# Patient Record
Sex: Female | Born: 1955
Health system: Southern US, Community
[De-identification: ages and names within clinical notes are randomized; demographics above are authoritative.]

## PROBLEM LIST (undated history)

## (undated) DIAGNOSIS — F419 Anxiety disorder, unspecified: Secondary | ICD-10-CM

## (undated) DIAGNOSIS — J45909 Unspecified asthma, uncomplicated: Secondary | ICD-10-CM

## (undated) DIAGNOSIS — J019 Acute sinusitis, unspecified: Secondary | ICD-10-CM

## (undated) HISTORY — PX: LARYNGOSCOPY: SUR817

## (undated) HISTORY — PX: TONSILLECTOMY: SUR1361

## (undated) HISTORY — PX: ADENOIDECTOMY: SUR15

## (undated) HISTORY — DX: Unspecified asthma, uncomplicated: J45.909

## (undated) HISTORY — PX: ABDOMINAL HYSTERECTOMY: SHX81

## (undated) HISTORY — DX: Acute sinusitis, unspecified: J01.90

## (undated) HISTORY — DX: Anxiety disorder, unspecified: F41.9

---

## 1998-06-09 ENCOUNTER — Emergency Department (HOSPITAL_COMMUNITY): Admission: EM | Admit: 1998-06-09 | Discharge: 1998-06-09 | Payer: Self-pay | Admitting: Emergency Medicine

## 1999-03-29 ENCOUNTER — Emergency Department (HOSPITAL_COMMUNITY): Admission: EM | Admit: 1999-03-29 | Discharge: 1999-03-29 | Payer: Self-pay | Admitting: Emergency Medicine

## 2000-09-27 ENCOUNTER — Other Ambulatory Visit: Admission: RE | Admit: 2000-09-27 | Discharge: 2000-09-27 | Payer: Self-pay | Admitting: *Deleted

## 2001-09-27 ENCOUNTER — Ambulatory Visit: Admission: RE | Admit: 2001-09-27 | Discharge: 2001-09-27 | Payer: Self-pay | Admitting: Internal Medicine

## 2001-09-28 ENCOUNTER — Other Ambulatory Visit: Admission: RE | Admit: 2001-09-28 | Discharge: 2001-09-28 | Payer: Self-pay | Admitting: *Deleted

## 2002-09-28 ENCOUNTER — Other Ambulatory Visit: Admission: RE | Admit: 2002-09-28 | Discharge: 2002-09-28 | Payer: Self-pay | Admitting: *Deleted

## 2003-11-17 ENCOUNTER — Emergency Department (HOSPITAL_COMMUNITY): Admission: EM | Admit: 2003-11-17 | Discharge: 2003-11-18 | Payer: Self-pay | Admitting: Emergency Medicine

## 2003-11-26 ENCOUNTER — Other Ambulatory Visit: Admission: RE | Admit: 2003-11-26 | Discharge: 2003-11-26 | Payer: Self-pay | Admitting: *Deleted

## 2004-12-01 ENCOUNTER — Other Ambulatory Visit: Admission: RE | Admit: 2004-12-01 | Discharge: 2004-12-01 | Payer: Self-pay | Admitting: *Deleted

## 2005-12-09 ENCOUNTER — Emergency Department (HOSPITAL_COMMUNITY): Admission: EM | Admit: 2005-12-09 | Discharge: 2005-12-09 | Payer: Self-pay | Admitting: Emergency Medicine

## 2005-12-30 ENCOUNTER — Other Ambulatory Visit: Admission: RE | Admit: 2005-12-30 | Discharge: 2005-12-30 | Payer: Self-pay | Admitting: *Deleted

## 2006-12-21 ENCOUNTER — Other Ambulatory Visit: Admission: RE | Admit: 2006-12-21 | Discharge: 2006-12-21 | Payer: Self-pay | Admitting: *Deleted

## 2007-08-05 ENCOUNTER — Encounter: Admission: RE | Admit: 2007-08-05 | Discharge: 2007-08-05 | Payer: Self-pay | Admitting: Family Medicine

## 2009-06-05 ENCOUNTER — Emergency Department (HOSPITAL_COMMUNITY): Admission: EM | Admit: 2009-06-05 | Discharge: 2009-06-05 | Payer: Self-pay | Admitting: Emergency Medicine

## 2010-02-24 ENCOUNTER — Other Ambulatory Visit: Payer: Self-pay | Admitting: Family Medicine

## 2010-02-24 ENCOUNTER — Other Ambulatory Visit (HOSPITAL_COMMUNITY)
Admission: RE | Admit: 2010-02-24 | Discharge: 2010-02-24 | Disposition: A | Discharge status: Home / Self Care | Payer: 59 | Source: Ambulatory Visit | Attending: Family Medicine | Admitting: Family Medicine

## 2010-02-24 DIAGNOSIS — Z124 Encounter for screening for malignant neoplasm of cervix: Secondary | ICD-10-CM | POA: Insufficient documentation

## 2011-06-12 ENCOUNTER — Other Ambulatory Visit: Payer: Self-pay | Admitting: Gastroenterology

## 2011-12-23 ENCOUNTER — Other Ambulatory Visit: Payer: Self-pay | Admitting: Family Medicine

## 2011-12-23 DIAGNOSIS — R102 Pelvic and perineal pain: Secondary | ICD-10-CM

## 2011-12-24 ENCOUNTER — Ambulatory Visit
Admission: RE | Admit: 2011-12-24 | Discharge: 2011-12-24 | Disposition: A | Discharge status: Home / Self Care | Payer: 59 | Source: Ambulatory Visit | Attending: Family Medicine | Admitting: Family Medicine

## 2011-12-24 DIAGNOSIS — R102 Pelvic and perineal pain: Secondary | ICD-10-CM

## 2012-05-19 ENCOUNTER — Ambulatory Visit (INDEPENDENT_AMBULATORY_CARE_PROVIDER_SITE_OTHER): Payer: 59 | Admitting: Emergency Medicine

## 2012-05-19 VITALS — BP 96/56 | HR 49 | Temp 97.6°F | Resp 16 | Ht 65.75 in | Wt 111.2 lb

## 2012-05-19 DIAGNOSIS — Z111 Encounter for screening for respiratory tuberculosis: Secondary | ICD-10-CM

## 2012-05-19 NOTE — Progress Notes (Signed)
[facility] [address] [place] [address] [phone]  Date:  05/19/2012   Name:  Alejandra Young   DOB:  09/30/1955   MRN:  [id number]  PCP:  Gweneth Dimitri, MD    Chief Complaint: Immunizations   History of Present Illness:  Alejandra Young is a 57 y.o. very pleasant female patient who presents with the following:  Needs a TB test for rotation at [facility].  No history of TB  There are no active problems to display for this patient.   No past medical history on file.  No past surgical history on file.  History  Substance Use Topics  . Smoking status: Former Games developer  . Smokeless tobacco: Not on file  . Alcohol Use: Not on file    No family history on file.  Allergies  Allergen Reactions  . Codeine Anxiety    Medication list has been reviewed and updated.  No current outpatient prescriptions on file prior to visit.   No current facility-administered medications on file prior to visit.    Review of Systems:  As per HPI, otherwise negative.    Physical Examination: Filed Vitals:   05/19/12 1806  BP: 96/56  Pulse: 49  Temp: 97.6 F (36.4 C)  Resp: 16   Filed Vitals:   05/19/12 1806  Height: 5' 5.75" (1.67 m)  Weight: 111 lb 3.2 oz (50.44 kg)   Body mass index is 18.09 kg/(m^2). Ideal Body Weight: Weight in (lb) to have BMI = 25: 153.4   GEN: WDWN, NAD, Non-toxic, Alert & Oriented x 3 HEENT: Atraumatic, Normocephalic.  Ears and Nose: No external deformity. EXTR: No clubbing/cyanosis/edema NEURO: Normal gait.  PSYCH: Normally interactive. Conversant. Not depressed or anxious appearing.  Calm demeanor.    Assessment and Plan: TB skin test   Signed,  Phillips Odor, MD

## 2012-05-19 NOTE — Patient Instructions (Addendum)

## 2012-05-19 NOTE — Progress Notes (Signed)
  Tuberculosis Risk Questionnaire  1. Were you born outside the [place] in one of the following parts of the world: [place], [place], [place], [place] or [place]?  NO  2. Have you traveled outside the [place] and lived for more than one month in one of the following parts of the world: [place], [place], [place], [place] or [place]?  NO  3. Do you have a compromised immune system such as from any of the following conditions:HIV/AIDS, organ or bone marrow transplantation, diabetes, immunosuppressive medicines (e.g. Prednisone, Remicaide), leukemia, lymphoma, cancer of the head or neck, gastrectomy or jejunal bypass, end-stage renal disease (on dialysis), or silicosis?             NO 4. Have you ever or do you plan on working in: a residential care center, a health care facility, a jail or prison or homeless shelter?  YES  5. Have you ever: injected illegal drugs, used crack cocaine, lived in a homeless shelter  or been in jail or prison?   NO  6. Have you ever been exposed to anyone with infectious tuberculosis?             NO   Tuberculosis Symptom Questionnaire  Do you currently have any of the following symptoms?  1. Unexplained cough lasting more than 3 weeks? NO  Unexplained fever lasting more than 3 weeks. NO  3. Night Sweats (sweating that leaves the bedclothes and sheets wet)   NO  4. Shortness of Breath NO  5. Chest Pain NO  6. Unintentional weight loss  NO  7. Unexplained fatigue (very tired for no reason) NO

## 2012-05-20 NOTE — Progress Notes (Signed)
Reviewed and agree.

## 2012-05-21 ENCOUNTER — Ambulatory Visit (INDEPENDENT_AMBULATORY_CARE_PROVIDER_SITE_OTHER): Payer: 59 | Admitting: *Deleted

## 2012-05-21 DIAGNOSIS — Z111 Encounter for screening for respiratory tuberculosis: Secondary | ICD-10-CM

## 2013-03-11 ENCOUNTER — Ambulatory Visit (INDEPENDENT_AMBULATORY_CARE_PROVIDER_SITE_OTHER): Payer: 59 | Admitting: Physician Assistant

## 2013-03-11 VITALS — BP 106/58 | HR 48 | Temp 98.4°F | Resp 16 | Ht 64.75 in | Wt 116.2 lb

## 2013-03-11 DIAGNOSIS — J019 Acute sinusitis, unspecified: Secondary | ICD-10-CM

## 2013-03-11 MED ORDER — AZITHROMYCIN 250 MG PO TABS: ORAL_TABLET | ORAL | Status: DC

## 2013-03-11 MED ORDER — IPRATROPIUM BROMIDE 0.06 % NA SOLN: 2.0000 | Freq: Three times a day (TID) | NASAL | Status: DC

## 2013-03-11 NOTE — Progress Notes (Signed)
Subjective:    Patient ID: Alejandra Young, female    DOB: [birth year], 58 y.o.   MRN: [id number]  HPI Primary Physician: Cari Caraway, MD  Chief Complaint: URI x 2 days  HPI: 58 y.o. female with history below presents with 2 day history of ST, post nasal drip, sinus pressure, ear fullness, nasal congestion, rhinorrhea, sinus headache, and fatigue. No cough, SOB or wheezing. Sinus pressure is the worst symptom along the maxillary symptom. Had a sinus infection in January that was successfully treated with 2 rounds of a Z pack. Notes throughout her life whenever she has gotten a URI she has always gotten a right sided ST that radiates down the right side of her throat to her right lung. She has had this worked up and never found a cause.     Past Medical History  Diagnosis Date  . Anxiety      Home Meds: Prior to Admission medications   Medication Sig Start Date End Date Taking? Authorizing Provider  clonazePAM (KLONOPIN) 0.5 MG tablet Take 0.5 mg by mouth 2 (two) times daily as needed for anxiety.   Yes Historical Provider, MD  escitalopram (LEXAPRO) 10 MG tablet Take 10 mg by mouth daily.   Yes Historical Provider, MD  estradiol (VIVELLE-DOT) 0.1 MG/24HR Place 1 patch onto the skin 2 (two) times a week.   Yes Historical Provider, MD  fluticasone (FLONASE) 50 MCG/ACT nasal spray Place 2 sprays into the nose daily.   Yes Historical Provider, MD  sulfamethoxazole-trimethoprim (BACTRIM,SEPTRA) 200-40 MG/5ML suspension Take 200 mg by mouth 2 (two) times daily.   Yes Historical Provider, MD    Allergies:  Allergies  Allergen Reactions  . Codeine Anxiety    History   Social History  . Marital Status: Married    Spouse Name: N/A    Number of Children: N/A  . Years of Education: N/A   Occupational History  . Not on file.   Social History Main Topics  . Smoking status: Former Research scientist (life sciences)  . Smokeless tobacco: Not on file  . Alcohol Use: Not on file  . Drug Use: Not on file  .  Sexual Activity: Not on file   Other Topics Concern  . Not on file   Social History Narrative  . No narrative on file     Review of Systems  Constitutional: Positive for fatigue. Negative for fever and chills.  HENT: Positive for congestion, hearing loss, postnasal drip, rhinorrhea, sinus pressure and sore throat. Negative for ear pain.        Nasal congestion.   Respiratory: Negative for cough, shortness of breath and wheezing.   Gastrointestinal: Negative for nausea, vomiting and diarrhea.  Neurological: Positive for headaches.       Sinus headache.        Objective:   Physical Exam  Physical Exam: Blood pressure 106/58, pulse 48, temperature 98.4 F (36.9 C), temperature source Oral, resp. rate 16, height 5' 4.75" (1.645 m), weight 116 lb 3.4 oz (52.713 kg), SpO2 97.00%., Body mass index is 19.48 kg/(m^2). General: Well developed, well nourished, in no acute distress. Head: Normocephalic, atraumatic, eyes without discharge, sclera non-icteric, nares are congested. Bilateral auditory canals clear, TM's are without perforation, pearly grey and translucent with reflective cone of light bilaterally. Bilateral maxillary sinus TTP. Oral cavity moist, posterior pharynx without exudate, erythema, peritonsillar abscess, or post nasal drip. Uvula midline.  Neck: Supple. No thyromegaly. Full ROM. No lymphadenopathy. No nuchal rigidity.  Lungs: Clear bilaterally to auscultation  without wheezes, rales, or rhonchi. Breathing is unlabored. Heart: RRR with S1 S2. No murmurs, rubs, or gallops appreciated. Msk:  Strength and tone normal for age. Extremities/Skin: Warm and dry. No clubbing or cyanosis. No edema. No rashes or suspicious lesions. Neuro: Alert and oriented X 3. Moves all extremities spontaneously. Gait is normal. CNII-XII grossly in tact. Psych:  Responds to questions appropriately with a normal affect.        Assessment & Plan:  58 year old female with sinusitis -Azithromycin  250 MG #6 2 po first day then 1 po next 4 days no RF -Atrovent NS 0.06% 2 sprays each nare bid prn #1 no RF -Supportive care -Follow up with PCP   Christell Faith, MHS, PA-C [facility] [place], [place] [address] [facility] 03/11/2013 5:57 PM

## 2013-03-11 NOTE — Patient Instructions (Signed)

## 2013-04-03 ENCOUNTER — Other Ambulatory Visit: Payer: Self-pay | Admitting: Family Medicine

## 2013-04-03 ENCOUNTER — Other Ambulatory Visit (HOSPITAL_COMMUNITY)
Admission: RE | Admit: 2013-04-03 | Discharge: 2013-04-03 | Disposition: A | Discharge status: Home / Self Care | Payer: 59 | Source: Ambulatory Visit | Attending: Family Medicine | Admitting: Family Medicine

## 2013-04-03 DIAGNOSIS — Z124 Encounter for screening for malignant neoplasm of cervix: Secondary | ICD-10-CM | POA: Insufficient documentation

## 2013-04-03 DIAGNOSIS — Z1151 Encounter for screening for human papillomavirus (HPV): Secondary | ICD-10-CM | POA: Insufficient documentation

## 2013-04-03 DIAGNOSIS — Z0000 Encounter for general adult medical examination without abnormal findings: Secondary | ICD-10-CM | POA: Insufficient documentation

## 2014-11-14 ENCOUNTER — Ambulatory Visit: Payer: Self-pay | Admitting: Allergy and Immunology

## 2014-11-20 ENCOUNTER — Ambulatory Visit (INDEPENDENT_AMBULATORY_CARE_PROVIDER_SITE_OTHER): Payer: Self-pay | Admitting: Allergy and Immunology

## 2014-11-20 ENCOUNTER — Encounter: Payer: Self-pay | Admitting: Allergy and Immunology

## 2014-11-20 VITALS — BP 110/60 | HR 60 | Temp 98.0°F | Resp 16

## 2014-11-20 DIAGNOSIS — J4530 Mild persistent asthma, uncomplicated: Secondary | ICD-10-CM | POA: Insufficient documentation

## 2014-11-20 DIAGNOSIS — J4520 Mild intermittent asthma, uncomplicated: Secondary | ICD-10-CM | POA: Insufficient documentation

## 2014-11-20 DIAGNOSIS — J0100 Acute maxillary sinusitis, unspecified: Secondary | ICD-10-CM

## 2014-11-20 DIAGNOSIS — J3089 Other allergic rhinitis: Secondary | ICD-10-CM | POA: Insufficient documentation

## 2014-11-20 DIAGNOSIS — J4540 Moderate persistent asthma, uncomplicated: Secondary | ICD-10-CM

## 2014-11-20 NOTE — Assessment & Plan Note (Signed)
   Prednisone has been provided, 40 mg x3 days, 20 mg x1 day, 10 mg x1 day, then stop.  Continue Qnasl 80 g, one actuation per nostril twice daily.  I have encouraged use of nasal saline lavage 2 or 3 times per day until sinusitis has resolved.  A prescription has been provided for ipratropium 0.06% nasal spray, 1-2 sprays per nostril 3 times per day as needed.  The patient has been asked to contact me if her symptoms persist, progress, or if she becomes febrile. Otherwise, she may return for follow up in 4 months.

## 2014-11-20 NOTE — Patient Instructions (Signed)
Moderate persistent asthma Well-controlled.  We will stepdown therapy once sinusitis has resolved.  When sinusitis has resolved, Alejandra Young may decrease Qvar 80 g to one inhalation via spacer device twice a day.  If lower respiratory symptoms progress in frequency and/or severity, she is to resume the previous dose.  Continue albuterol HFA, 1-2 inhalations every 4-6 hours as needed.  Subjective and objective measures of pulmonary function will be followed and the treatment plan will be adjusted accordingly.  Acute sinusitis  Prednisone has been provided, 40 mg x3 days, 20 mg x1 day, 10 mg x1 day, then stop.  Continue Qnasl 80 g, one actuation per nostril twice daily.  I have encouraged use of nasal saline lavage 2 or 3 times per day until sinusitis has resolved.  A prescription has been provided for ipratropium 0.06% nasal spray, 1-2 sprays per nostril 3 times per day as needed.  The patient has been asked to contact me if her symptoms persist, progress, or if she becomes febrile. Otherwise, she may return for follow up in 4 months.  Allergic rhinitis with nonallergic component  Continue Qnasl and nasal saline lavage as needed.  Add ipratropium nasal spray (as above).   Return in about 4 months (around 03/20/2015), or if symptoms worsen or fail to improve.

## 2014-11-20 NOTE — Assessment & Plan Note (Signed)
   Continue Qnasl and nasal saline lavage as needed.  Add ipratropium nasal spray (as above).

## 2014-11-20 NOTE — Assessment & Plan Note (Signed)
Well-controlled.  We will stepdown therapy once sinusitis has resolved.  When sinusitis has resolved, Alejandra Young may decrease Qvar 80 g to one inhalation via spacer device twice a day.  If lower respiratory symptoms progress in frequency and/or severity, she is to resume the previous dose.  Continue albuterol HFA, 1-2 inhalations every 4-6 hours as needed.  Subjective and objective measures of pulmonary function will be followed and the treatment plan will be adjusted accordingly.

## 2014-11-20 NOTE — Progress Notes (Signed)
History of present illness: HPI Comments: Alejandra Young is a 59 y.o. female with persistent asthma and mixed rhinitis who presents today for a sick visit.  She reports that she had a sinus infection at the end of October.  She went to urgent care and was prescribed azithromycin.  She experienced temporary symptom reduction, however the symptoms have returned.  She is currently experiencing severe sinus pressure/pain, particularly over the maxillary sinuses, nasal congestion, postnasal drainage, and sore throat.  She denies fevers, chills, or discolored mucus production.  She reports that her asthma has been well controlled on the current regimen in the interval since her previous visit.  She has only required albuterol rescue on one or 2 occasions over the past few months.   Assessment and plan: Moderate persistent asthma Well-controlled.  We will stepdown therapy once sinusitis has resolved.  When sinusitis has resolved, [name] may decrease Qvar 80 g to one inhalation via spacer device twice a day.  If lower respiratory symptoms progress in frequency and/or severity, she is to resume the previous dose.  Continue albuterol HFA, 1-2 inhalations every 4-6 hours as needed.  Subjective and objective measures of pulmonary function will be followed and the treatment plan will be adjusted accordingly.  Acute sinusitis  Prednisone has been provided, 40 mg x3 days, 20 mg x1 day, 10 mg x1 day, then stop.  Continue Qnasl 80 g, one actuation per nostril twice daily.  I have encouraged use of nasal saline lavage 2 or 3 times per day until sinusitis has resolved.  A prescription has been provided for ipratropium 0.06% nasal spray, 1-2 sprays per nostril 3 times per day as needed.  The patient has been asked to contact me if her symptoms persist, progress, or if she becomes febrile. Otherwise, she may return for follow up in 4 months.  Allergic rhinitis with nonallergic component  Continue Qnasl and  nasal saline lavage as needed.  Add ipratropium nasal spray (as above).    Medications ordered this encounter: No orders of the defined types were placed in this encounter.    Diagnositics: Spirometry:  Normal.  Please see scanned spirometry results for details.     Physical examination: Blood pressure 110/60, pulse 60, temperature 98 F (36.7 C), temperature source Oral, resp. rate 16.  General: Alert, interactive, in no acute distress. HEENT: TMs pearly gray, turbinates markedly edematous without discharge, post-pharynx erythematous. Neck: Supple without lymphadenopathy. Lungs: Clear to auscultation without wheezing, rhonchi or rales. CV: Normal S1, S2 without murmurs. Skin: Warm and dry, without lesions or rashes.  The following portions of the patient's history were reviewed and updated as appropriate: allergies, current medications, past family history, past medical history, past social history, past surgical history and problem list.  Outpatient medications:   Medication List       This list is accurate as of: 11/20/14  1:29 PM.  Always use your most recent med list.               azithromycin 250 MG tablet  Commonly known as:  ZITHROMAX Z-PAK  2 tabs po first day, then 1 tab po next 4 days     beclomethasone 80 MCG/ACT inhaler  Commonly known as:  QVAR  Inhale 2 puffs into the lungs 2 (two) times daily.     clonazePAM 0.5 MG tablet  Commonly known as:  KLONOPIN  Take 0.5 mg by mouth 2 (two) times daily as needed for anxiety.     escitalopram 10 MG  tablet  Commonly known as:  LEXAPRO  Take 10 mg by mouth daily.     estradiol 0.1 MG/24HR patch  Commonly known as:  VIVELLE-DOT  Place 1 patch onto the skin 2 (two) times a week.     fluticasone 110 MCG/ACT inhaler  Commonly known as:  FLOVENT HFA  Inhale 2 puffs into the lungs 2 (two) times daily.     fluticasone 50 MCG/ACT nasal spray  Commonly known as:  FLONASE  Place 2 sprays into the nose daily.      guaiFENesin 600 MG 12 hr tablet  Commonly known as:  MUCINEX  Take by mouth 2 (two) times daily.     ipratropium 0.06 % nasal spray  Commonly known as:  ATROVENT  Place 2 sprays into the nose 3 (three) times daily.     PROAIR HFA 108 (90 BASE) MCG/ACT inhaler  Generic drug:  albuterol  Inhale into the lungs every 6 (six) hours as needed for wheezing or shortness of breath.     QNASL 80 MCG/ACT Aers  Generic drug:  Beclomethasone Dipropionate  Place 2 sprays into the nose 2 (two) times daily.     sulfamethoxazole-trimethoprim 200-40 MG/5ML suspension  Commonly known as:  BACTRIM,SEPTRA  Take 200 mg by mouth 2 (two) times daily.        Known medication allergies: Allergies  Allergen Reactions  . Codeine Anxiety    I appreciate the opportunity to take part in this Avamae's care. Please do not hesitate to contact me with questions.  Sincerely,   R. Edgar Frisk, MD

## 2014-12-03 ENCOUNTER — Telehealth: Payer: Self-pay | Admitting: Allergy and Immunology

## 2014-12-03 NOTE — Telephone Encounter (Signed)
Called patient states she has been using netipot twice a day, drainage has no color just clear, states it is more stuffiness and the pressure described as "a mask" around the front of her face with puffy eyes. Please advise.

## 2014-12-03 NOTE — Telephone Encounter (Signed)
Please prescribe prednisone 20 mg x 4 days, 10 mg x1 day, then stop. Please prescribe Augmentin 875/125 mg by mouth twice a day 14 days. Please provide referral to Dr. Melida Quitter with [facility].  Continue other interventions as discussed during her last visit. Thanks.

## 2014-12-03 NOTE — Telephone Encounter (Signed)
Pt called and said that she seen dr Verlin Fester 2 weeks on ago for a sinus infection and she thinks it has not gotten better. She still has a lot of sinus pressure and pain. [phone]. Kenn[place].

## 2014-12-04 MED ORDER — PREDNISONE 10 MG PO TABS: ORAL_TABLET | ORAL | Status: DC

## 2014-12-04 MED ORDER — AMOXICILLIN-POT CLAVULANATE 875-125 MG PO TABS: ORAL_TABLET | ORAL | Status: DC

## 2014-12-04 MED ORDER — BECLOMETHASONE DIPROPIONATE 80 MCG/ACT NA AERS: 1.0000 | INHALATION_SPRAY | Freq: Two times a day (BID) | NASAL | Status: DC

## 2014-12-04 NOTE — Telephone Encounter (Signed)
Spoke with patient appt with Dr Redmond Baseman scheduled for [facility] Dec 6,2016 @ 2:30. Instructions given will mail out appt reminder.

## 2014-12-04 NOTE — Telephone Encounter (Addendum)
Spoke with patient advised as written per Dr Mancel Parsons sent in also requested Qnasl. Will work on referral to ENT Dr Redmond Baseman.

## 2014-12-04 NOTE — Telephone Encounter (Signed)
LM TO CALL OFFICE

## 2015-01-04 ENCOUNTER — Other Ambulatory Visit: Payer: Self-pay | Admitting: Neurology

## 2015-01-04 MED ORDER — BECLOMETHASONE DIPROPIONATE 80 MCG/ACT IN AERS: 2.0000 | INHALATION_SPRAY | Freq: Two times a day (BID) | RESPIRATORY_TRACT | Status: DC

## 2015-01-08 ENCOUNTER — Other Ambulatory Visit: Payer: Self-pay | Admitting: *Deleted

## 2015-01-08 MED ORDER — FLUTICASONE PROPIONATE HFA 220 MCG/ACT IN AERO: INHALATION_SPRAY | RESPIRATORY_TRACT | Status: DC

## 2015-01-15 ENCOUNTER — Other Ambulatory Visit: Payer: Self-pay | Admitting: *Deleted

## 2015-01-15 MED ORDER — BECLOMETHASONE DIPROPIONATE 80 MCG/ACT IN AERS: INHALATION_SPRAY | RESPIRATORY_TRACT | Status: DC

## 2015-02-04 ENCOUNTER — Encounter: Payer: Self-pay | Admitting: Allergy and Immunology

## 2015-02-04 ENCOUNTER — Ambulatory Visit (INDEPENDENT_AMBULATORY_CARE_PROVIDER_SITE_OTHER): Payer: 59 | Admitting: Allergy and Immunology

## 2015-02-04 VITALS — BP 108/62 | HR 60 | Temp 98.1°F | Resp 18

## 2015-02-04 DIAGNOSIS — J3089 Other allergic rhinitis: Secondary | ICD-10-CM | POA: Diagnosis not present

## 2015-02-04 DIAGNOSIS — J4540 Moderate persistent asthma, uncomplicated: Secondary | ICD-10-CM

## 2015-02-04 DIAGNOSIS — R059 Cough, unspecified: Secondary | ICD-10-CM | POA: Insufficient documentation

## 2015-02-04 DIAGNOSIS — R040 Epistaxis: Secondary | ICD-10-CM | POA: Diagnosis not present

## 2015-02-04 DIAGNOSIS — R05 Cough: Secondary | ICD-10-CM

## 2015-02-04 MED ORDER — OMEPRAZOLE 20 MG PO CPDR: 20.0000 mg | DELAYED_RELEASE_CAPSULE | Freq: Every day | ORAL | Status: DC

## 2015-02-04 MED ORDER — IPRATROPIUM BROMIDE 0.03 % NA SOLN: NASAL | Status: DC

## 2015-02-04 MED ORDER — BENZONATATE 100 MG PO CAPS: 100.0000 mg | ORAL_CAPSULE | Freq: Three times a day (TID) | ORAL | Status: DC

## 2015-02-04 NOTE — Assessment & Plan Note (Signed)
   For now, continue Qvar 80 g, 2 inhalations via spacer device twice a day.  Increase to 2 inhalations 3 times a day during exacerbations or flares.  Continue albuterol HFA as needed.  Subjective and objective measures of pulmonary function will be followed and the treatment plan will be adjusted accordingly.

## 2015-02-04 NOTE — Assessment & Plan Note (Addendum)
A proton pump therapeutic trial will be initiated. We will not change the ICS dose at this time to avoid mixing variables.   A prescription has been provided for omeprazole 20 mg daily prior to meals.   For now, continue Qvar 80 g as directed.  Continue aggressive treatment of post nasal drainage for now.  A prescription has been provided for benzonatate 100 mg every 8 hours as needed.  If this problem persists or progresses, laryngoscopy may be helpful.

## 2015-02-04 NOTE — Progress Notes (Signed)
Follow-up Note  RE: Alejandra Young MRN: [id number] DOB: 09/24/1955 Date of Office Visit: 02/04/2015  Primary care provider: Cari Caraway, MD Referring provider: Cari Caraway, MD  History of present illness: HPI Comments: Alejandra Young is a 60 y.o. female with persistent asthma and mixed rhinitis who presents today for sick visit.  She was most recently seen in this office on 11/20/2014.  She complains of continued coughing.  The coughing is typically nonproductive and feels like it originates from the base of the throat.  She experiences postnasal drainage and irritated throat.  She has occasional heartburn symptoms.  She denies dyspnea or wheezing over the past few months.  In addition, she complains of minor epistaxis which occurs almost daily and is from the right nostril "99% of the time."   Assessment and plan: Coughing A proton pump therapeutic trial will be initiated. We will not change the ICS dose at this time to avoid mixing variables.   A prescription has been provided for omeprazole 20 mg daily prior to meals.   For now, continue Qvar 80 g as directed.  Continue aggressive treatment of post nasal drainage for now.  A prescription has been provided for benzonatate 100 mg every 8 hours as needed.  If this problem persists or progresses, laryngoscopy may be helpful.  Epistaxis  Hold Qnasl for now.  A prescription has been provided for ipratropium 0.06% nasal spray every 8 hours as needed.  Continue nasal saline irrigation as needed.  If epistaxis persists or progresses, follow up with otolaryngology for further evaluation.  Moderate persistent asthma  For now, continue Qvar 80 g, 2 inhalations via spacer device twice a day.  Increase to 2 inhalations 3 times a day during exacerbations or flares.  Continue albuterol HFA as needed.  Subjective and objective measures of pulmonary function will be followed and the treatment plan will be adjusted  accordingly.  Allergic rhinitis with nonallergic component  Ipratropium nasal spray has been prescribed (as above).  Continue appropriate allergen avoidance measures, nasal saline irrigation, and guaifenesin as needed.    Meds ordered this encounter  Medications  . omeprazole (PRILOSEC) 20 MG capsule    Sig: Take 1 capsule (20 mg total) by mouth daily.    Dispense:  30 capsule    Refill:  3  . benzonatate (TESSALON PERLES) 100 MG capsule    Sig: Take 1 capsule (100 mg total) by mouth 3 (three) times daily.    Dispense:  30 capsule    Refill:  1  . ipratropium (ATROVENT) 0.03 % nasal spray    Sig: Use 1-2 sprays daily as needed for stuffy nose or drainage.    Dispense:  30 mL    Refill:  5    Diagnositics: Spirometry:  Normal with an FEV1 of 104% predicted.  Please see scanned spirometry results for details.    Physical examination: Blood pressure 108/62, pulse 60, temperature 98.1 F (36.7 C), resp. rate 18, SpO2 97 %.  General: Alert, interactive, in no acute distress. HEENT: TMs pearly gray, turbinates moderately edematous without discharge, post-pharynx moderately erythematous. Neck: Supple without lymphadenopathy. Lungs: Clear to auscultation without wheezing, rhonchi or rales. CV: Normal S1, S2 without murmurs. Skin: Warm and dry, without lesions or rashes.  The following portions of the patient's history were reviewed and updated as appropriate: allergies, current medications, past family history, past medical history, past social history, past surgical history and problem list.    Medication List  This list is accurate as of: 02/04/15 12:18 PM.  Always use your most recent med list.               beclomethasone 80 MCG/ACT inhaler  Commonly known as:  QVAR  INHALE TWO PUFFS TWICE DAILY TO PREVENT COUGH OR WHEEZE. RINSE MOUTH AFTER USE. USE WITH SPACER.     Beclomethasone Dipropionate 80 MCG/ACT Aers  Commonly known as:  QNASL  Place 1 spray into  the nose 2 (two) times daily.     benzonatate 100 MG capsule  Commonly known as:  TESSALON PERLES  Take 1 capsule (100 mg total) by mouth 3 (three) times daily.     clonazePAM 0.5 MG tablet  Commonly known as:  KLONOPIN  Take 0.5 mg by mouth 2 (two) times daily as needed for anxiety.     escitalopram 10 MG tablet  Commonly known as:  LEXAPRO  Take 10 mg by mouth daily.     estradiol 0.1 MG/24HR patch  Commonly known as:  VIVELLE-DOT  Place 1 patch onto the skin 2 (two) times a week.     guaiFENesin 600 MG 12 hr tablet  Commonly known as:  MUCINEX  Take by mouth 2 (two) times daily.     ipratropium 0.03 % nasal spray  Commonly known as:  ATROVENT  Use 1-2 sprays daily as needed for stuffy nose or drainage.     omeprazole 20 MG capsule  Commonly known as:  PRILOSEC  Take 1 capsule (20 mg total) by mouth daily.     PROAIR HFA 108 (90 Base) MCG/ACT inhaler  Generic drug:  albuterol  Inhale into the lungs every 6 (six) hours as needed for wheezing or shortness of breath.     sulfamethoxazole-trimethoprim 200-40 MG/5ML suspension  Commonly known as:  BACTRIM,SEPTRA  Take 200 mg by mouth 2 (two) times daily.        Allergies  Allergen Reactions  . Codeine Anxiety   Review of systems: Constitutional: Negative for fever, chills and weight loss.  HENT: Positive for nosebleeds.   Positive for postnasal drainage. Eyes: Negative for blurred vision.  Respiratory: Negative for hemoptysis.   Positive for coughing. Cardiovascular: Negative for chest pain.  Gastrointestinal: Negative for diarrhea and constipation.  Genitourinary: Negative for dysuria.  Musculoskeletal: Negative for myalgias and joint pain.  Neurological: Negative for dizziness.  Endo/Heme/Allergies: Does not bruise/bleed easily.   Past Medical History  Diagnosis Date  . Anxiety   . Asthma   . Sinusitis, acute     No family history on file.  Social History   Social History  . Marital Status: Married     Spouse Name: N/A  . Number of Children: N/A  . Years of Education: N/A   Occupational History  . Not on file.   Social History Main Topics  . Smoking status: Former Research scientist (life sciences)  . Smokeless tobacco: Not on file  . Alcohol Use: Not on file  . Drug Use: Not on file  . Sexual Activity: Not on file   Other Topics Concern  . Not on file   Social History Narrative    I appreciate the opportunity to take part in this Yuridiana's care. Please do not hesitate to contact me with questions.  Sincerely,   R. Edgar Frisk, MD

## 2015-02-04 NOTE — Assessment & Plan Note (Signed)
   Ipratropium nasal spray has been prescribed (as above).  Continue appropriate allergen avoidance measures, nasal saline irrigation, and guaifenesin as needed.

## 2015-02-04 NOTE — Patient Instructions (Addendum)
Coughing A proton pump therapeutic trial will be initiated. We will not change the ICS dose at this time to avoid mixing variables.   A prescription has been provided for omeprazole 20 mg daily prior to meals.   For now, continue Qvar 80 g as directed.  Continue aggressive treatment of post nasal drainage for now.  A prescription has been provided for benzonatate 100 mg every 8 hours as needed.  If this problem persists or progresses, laryngoscopy may be helpful.  Epistaxis  Hold Qnasl for now.  A prescription has been provided for ipratropium 0.06% nasal spray every 8 hours as needed.  Continue nasal saline irrigation as needed.  If epistaxis persists or progresses, follow up with otolaryngology for further evaluation.  Moderate persistent asthma  For now, continue Qvar 80 g, 2 inhalations via spacer device twice a day.  Increase to 2 inhalations 3 times a day during exacerbations or flares.  Continue albuterol HFA as needed.  Subjective and objective measures of pulmonary function will be followed and the treatment plan will be adjusted accordingly.  Allergic rhinitis with nonallergic component  Ipratropium nasal spray has been prescribed (as above).  Continue appropriate allergen avoidance measures, nasal saline irrigation, and guaifenesin as needed.    Return in about 6 weeks (around 03/18/2015), or if symptoms worsen or fail to improve.

## 2015-02-04 NOTE — Assessment & Plan Note (Addendum)
   Hold Qnasl for now.  A prescription has been provided for ipratropium 0.06% nasal spray every 8 hours as needed.  Continue nasal saline irrigation as needed.  If epistaxis persists or progresses, follow up with otolaryngology for further evaluation.

## 2015-02-11 ENCOUNTER — Telehealth: Payer: Self-pay

## 2015-02-11 NOTE — Telephone Encounter (Signed)
Cx Qvar With [facility] [phone]. Patient tried to cx it and they stated that we had to call and CX the order  Please Advise  Thanks

## 2015-02-11 NOTE — Telephone Encounter (Signed)
Called and cancelled qvar and patient notified.

## 2015-02-12 ENCOUNTER — Other Ambulatory Visit: Payer: Self-pay | Admitting: Otolaryngology

## 2015-02-12 DIAGNOSIS — R0981 Nasal congestion: Secondary | ICD-10-CM

## 2015-02-12 DIAGNOSIS — R519 Headache, unspecified: Secondary | ICD-10-CM

## 2015-02-12 DIAGNOSIS — R51 Headache: Secondary | ICD-10-CM

## 2015-02-15 ENCOUNTER — Ambulatory Visit
Admission: RE | Admit: 2015-02-15 | Discharge: 2015-02-15 | Disposition: A | Discharge status: Home / Self Care | Payer: 59 | Source: Ambulatory Visit | Attending: Otolaryngology | Admitting: Otolaryngology

## 2015-02-15 DIAGNOSIS — R51 Headache: Secondary | ICD-10-CM

## 2015-02-15 DIAGNOSIS — R519 Headache, unspecified: Secondary | ICD-10-CM

## 2015-02-15 DIAGNOSIS — R0981 Nasal congestion: Secondary | ICD-10-CM

## 2015-03-19 ENCOUNTER — Ambulatory Visit: Payer: Self-pay | Admitting: Allergy and Immunology

## 2016-01-21 ENCOUNTER — Ambulatory Visit: Payer: 59 | Admitting: Allergy and Immunology

## 2016-01-28 ENCOUNTER — Ambulatory Visit (INDEPENDENT_AMBULATORY_CARE_PROVIDER_SITE_OTHER): Payer: 59 | Admitting: Allergy and Immunology

## 2016-01-28 ENCOUNTER — Encounter: Payer: Self-pay | Admitting: Allergy and Immunology

## 2016-01-28 VITALS — BP 100/74 | HR 74 | Temp 98.0°F | Resp 16 | Ht 64.0 in | Wt 117.0 lb

## 2016-01-28 DIAGNOSIS — J3089 Other allergic rhinitis: Secondary | ICD-10-CM | POA: Diagnosis not present

## 2016-01-28 DIAGNOSIS — J4530 Mild persistent asthma, uncomplicated: Secondary | ICD-10-CM | POA: Diagnosis not present

## 2016-01-28 MED ORDER — BECLOMETHASONE DIPROPIONATE 80 MCG/ACT NA AERS: 2.0000 | INHALATION_SPRAY | NASAL | 5 refills | Status: DC

## 2016-01-28 MED ORDER — BECLOMETHASONE DIPROPIONATE 80 MCG/ACT IN AERS
INHALATION_SPRAY | RESPIRATORY_TRACT | 3 refills | Status: DC
Start: 2016-01-28 — End: 2016-07-01

## 2016-01-28 NOTE — Patient Instructions (Addendum)
Allergic rhinitis with nonallergic component  Continue appropriate allergen avoidance measures.  A sample and prescription have been provided for Qnasl 80 g, one actuation per nostril twice daily as needed.  Proper technique has been discussed and demonstrated.  Nasal saline lavage (NeilMed) has been recommended prior to medicated nasal sprays and as needed along with instructions for proper administration.  Add guaifenesin 1200 mg (Mucinex Maximum Strength)  twice daily as needed with adequate hydration as discussed.  Mild persistent asthma Today's spirometry results, assessed while asymptomatic, suggest under-perception of bronchoconstriction.  Qvar 80 g, 2 inhalations via spacer device twice a day.  Albuterol HFA, 1-2 inhalations every 4-6 hours as needed and 15 minutes prior to vigorous exercise.  Subjective and objective measures of pulmonary function will be followed and the treatment plan will be adjusted accordingly.

## 2016-01-28 NOTE — Assessment & Plan Note (Signed)
   Continue appropriate allergen avoidance measures.  A sample and prescription have been provided for Qnasl 80 g, one actuation per nostril twice daily as needed.  Proper technique has been discussed and demonstrated.  Nasal saline lavage (NeilMed) has been recommended prior to medicated nasal sprays and as needed along with instructions for proper administration.  Add guaifenesin 1200 mg (Mucinex Maximum Strength)  twice daily as needed with adequate hydration as discussed.

## 2016-01-28 NOTE — Assessment & Plan Note (Signed)
Today's spirometry results, assessed while asymptomatic, suggest under-perception of bronchoconstriction.  Qvar 80 g, 2 inhalations via spacer device twice a day.  Albuterol HFA, 1-2 inhalations every 4-6 hours as needed and 15 minutes prior to vigorous exercise.  Subjective and objective measures of pulmonary function will be followed and the treatment plan will be adjusted accordingly.

## 2016-01-28 NOTE — Progress Notes (Signed)
Follow-up Note  RE: Alejandra Young MRN: [id number] DOB: [birth year] Date of Office Visit: 01/28/2016  Primary care provider: Cari Caraway, MD Referring provider: Cari Caraway, MD  History of present illness:  Alejandra Young is a 61 y.o. female with persistent asthma and allergic rhinitis presenting today for follow up.  She was last seen in this clinic in January 2017.  She complains of hoarseness, throat clearing, and a globus sensation in the base of her throat.  She states that this problem started with a "bad sinus infection" in early November.  She has been evaluated by her otolaryngologist, Dr. Lucia Gaskins and was started on omeprazole for presumed reflux with initial benefit, however after approximately 2 weeks hoarseness, throat clearing, and globus sensation resumed.  She believes that having restarted Qvar 80 g, 2 inhalations via spacer device twice a day, approximately 2 weeks ago has provided symptom relief.    Assessment and plan: Allergic rhinitis with nonallergic component  Continue appropriate allergen avoidance measures.  A sample and prescription have been provided for Qnasl 80 g, one actuation per nostril twice daily as needed.  Proper technique has been discussed and demonstrated.  Nasal saline lavage (NeilMed) has been recommended prior to medicated nasal sprays and as needed along with instructions for proper administration.  Add guaifenesin 1200 mg (Mucinex Maximum Strength)  twice daily as needed with adequate hydration as discussed.  Mild persistent asthma Today's spirometry results, assessed while asymptomatic, suggest under-perception of bronchoconstriction.  Qvar 80 g, 2 inhalations via spacer device twice a day.  Albuterol HFA, 1-2 inhalations every 4-6 hours as needed and 15 minutes prior to vigorous exercise.  Subjective and objective measures of pulmonary function will be followed and the treatment plan will be adjusted accordingly.   Meds ordered  this encounter  Medications  . beclomethasone (QVAR) 80 MCG/ACT inhaler    Sig: INHALE TWO PUFFS TWICE DAILY TO PREVENT COUGH OR WHEEZE. RINSE MOUTH AFTER USE.    Dispense:  1 Inhaler    Refill:  3    90-DAY SUPPLY  . Beclomethasone Dipropionate (QNASL) 80 MCG/ACT AERS    Sig: Place 2 sprays into both nostrils 1 day or 1 dose.    Dispense:  8.7 g    Refill:  5    Diagnostics: Spirometry reveals an FVC of 3.05 L (101% predicted) and an FEV1 of 1.76 L (73% predicted) with significant postbronchodilator improvement.  Please see scanned spirometry results for details.    Physical examination: Blood pressure 100/74, pulse 74, temperature 98 F (36.7 C), temperature source Oral, resp. rate 16, height 5\' 4"  (1.626 m), weight 117 lb (53.1 kg), SpO2 98 %.  General: Alert, interactive, in no acute distress. HEENT: TMs pearly gray, turbinates moderately edematous with thick discharge, post-pharynx erythematous. Neck: Supple without lymphadenopathy. Lungs: Clear to auscultation without wheezing, rhonchi or rales. CV: Normal S1, S2 without murmurs. Skin: Warm and dry, without lesions or rashes.   The following portions of the patient's history were reviewed and updated as appropriate: allergies, current medications, past family history, past medical history, past social history, past surgical history and problem list.  Allergies as of 01/28/2016      Reactions   Codeine Anxiety      Medication List       Accurate as of 01/28/16  8:51 PM. Always use your most recent med list.          beclomethasone 80 MCG/ACT inhaler Commonly known as:  QVAR INHALE TWO PUFFS  TWICE DAILY TO PREVENT COUGH OR WHEEZE. RINSE MOUTH AFTER USE.   Beclomethasone Dipropionate 80 MCG/ACT Aers Commonly known as:  QNASL Place 2 sprays into both nostrils 1 day or 1 dose.   clonazePAM 0.5 MG tablet Commonly known as:  KLONOPIN Take 0.5 mg by mouth 2 (two) times daily as needed for anxiety.   escitalopram 10  MG tablet Commonly known as:  LEXAPRO Take 10 mg by mouth daily.   estradiol 0.1 MG/24HR patch Commonly known as:  VIVELLE-DOT Place 1 patch onto the skin 2 (two) times a week.   fluticasone 50 MCG/ACT nasal spray Commonly known as:  FLONASE Place 1 spray into both nostrils 2 (two) times daily as needed for allergies or rhinitis.   PROAIR HFA 108 (90 Base) MCG/ACT inhaler Generic drug:  albuterol Inhale into the lungs every 6 (six) hours as needed for wheezing or shortness of breath.   sulfamethoxazole-trimethoprim 200-40 MG/5ML suspension Commonly known as:  BACTRIM,SEPTRA Take 200 mg by mouth 2 (two) times daily.       Allergies  Allergen Reactions  . Codeine Anxiety    I appreciate the opportunity to take part in Dekisha's care. Please do not hesitate to contact me with questions.  Sincerely,   R. Edgar Frisk, MD

## 2016-01-30 DIAGNOSIS — K219 Gastro-esophageal reflux disease without esophagitis: Secondary | ICD-10-CM | POA: Insufficient documentation

## 2016-02-11 ENCOUNTER — Telehealth: Payer: Self-pay | Admitting: Allergy and Immunology

## 2016-02-11 DIAGNOSIS — R05 Cough: Secondary | ICD-10-CM

## 2016-02-11 DIAGNOSIS — R059 Cough, unspecified: Secondary | ICD-10-CM

## 2016-02-11 NOTE — Telephone Encounter (Signed)
Patient saw Dr. Verlin Fester 01-28-16. She said they discussed getting a chest x-ray. Dr. Verlin Fester told her to ask her PCP if they would order one for her. The doctor told her that they cannot order one for her because they were not treating her for anything that is wrong with her chest. She would like to know if Dr. Verlin Fester would be able to order one for her.

## 2016-02-11 NOTE — Telephone Encounter (Signed)
Please order a chest xray, PA and lateral. Dx: cough. Thanks.

## 2016-02-11 NOTE — Telephone Encounter (Signed)
Dr Verlin Fester please advise do you want to order chest xray

## 2016-02-11 NOTE — Telephone Encounter (Signed)
Left message for patient to call office. Sent chest xray order to [place].

## 2016-02-11 NOTE — Telephone Encounter (Signed)
Informed patient of chest x-ray.

## 2016-02-13 ENCOUNTER — Ambulatory Visit
Admission: RE | Admit: 2016-02-13 | Discharge: 2016-02-13 | Disposition: A | Discharge status: Home / Self Care | Payer: 59 | Source: Ambulatory Visit | Attending: Allergy and Immunology | Admitting: Allergy and Immunology

## 2016-02-13 DIAGNOSIS — R05 Cough: Secondary | ICD-10-CM

## 2016-02-13 DIAGNOSIS — R059 Cough, unspecified: Secondary | ICD-10-CM

## 2016-02-14 ENCOUNTER — Telehealth: Payer: Self-pay | Admitting: Allergy and Immunology

## 2016-02-14 NOTE — Telephone Encounter (Signed)
Patient had a chest x-ray yesterday, 02-13-16, and she wants to know if the results are back yet and if so, can anyone read them to her. I told her Dr. Verlin Fester was off today.

## 2016-02-14 NOTE — Telephone Encounter (Signed)
Please advise. Thank you

## 2016-02-17 ENCOUNTER — Telehealth: Payer: Self-pay | Admitting: Allergy and Immunology

## 2016-02-17 DIAGNOSIS — J4530 Mild persistent asthma, uncomplicated: Secondary | ICD-10-CM

## 2016-02-17 NOTE — Telephone Encounter (Signed)
I spoke with the patient. Please put in a lab order (to [facility]) for alpha-1-antitrypsin level and gene panel. Find out from her which [facility] she wants to go to (her husband works there).

## 2016-02-17 NOTE — Telephone Encounter (Signed)
Returning your call about her chest x-ray.

## 2016-02-17 NOTE — Telephone Encounter (Signed)
Patient has been advised to go get labs drawn.

## 2016-02-17 NOTE — Telephone Encounter (Signed)
Called, left message to call back.

## 2016-02-18 ENCOUNTER — Telehealth: Payer: Self-pay | Admitting: Allergy and Immunology

## 2016-02-18 LAB — ALPHA-1 ANTITRYPSIN PHENOTYPE: A-1 Antitrypsin: 98 mg/dL (ref 90–200)

## 2016-02-18 NOTE — Telephone Encounter (Signed)
Patient had blood work done and would like to know if a nurse would call her about the results.

## 2016-02-18 NOTE — Telephone Encounter (Signed)
Advised patient not all results back yet will contact when final results come in

## 2016-04-23 ENCOUNTER — Other Ambulatory Visit: Payer: Self-pay | Admitting: Allergy and Immunology

## 2016-04-23 MED ORDER — ALBUTEROL SULFATE HFA 108 (90 BASE) MCG/ACT IN AERS: 1.0000 | INHALATION_SPRAY | Freq: Four times a day (QID) | RESPIRATORY_TRACT | 1 refills | Status: DC | PRN

## 2016-04-23 NOTE — Telephone Encounter (Signed)
patient needs a PRO AIR refill - thinks that her current inhaler is a sample and she is using it more now and is running out Called the pharmacy and they do not have a script on file for the patient which is why she feels that she has a sample

## 2016-04-23 NOTE — Telephone Encounter (Signed)
Prescription has been sent, will call patient and advise.

## 2016-04-30 ENCOUNTER — Other Ambulatory Visit: Payer: Self-pay | Admitting: *Deleted

## 2016-04-30 MED ORDER — ALBUTEROL SULFATE HFA 108 (90 BASE) MCG/ACT IN AERS: 1.0000 | INHALATION_SPRAY | Freq: Four times a day (QID) | RESPIRATORY_TRACT | 1 refills | Status: DC | PRN

## 2016-06-16 ENCOUNTER — Telehealth: Payer: Self-pay | Admitting: Allergy and Immunology

## 2016-06-16 NOTE — Telephone Encounter (Signed)
Patient called and said she was having throat problems and has see Dr. Janace Hoard for this. He put her on protonix because he said she had silent relux. It worked well. She said her throat issue is now back and said she doesn't know if it is still silent relux or possible the Qvar she is on. She wants to know if she needs to be seen.

## 2016-06-16 NOTE — Telephone Encounter (Signed)
Sure have her come in for a follow up visit since it's been about 6 months.

## 2016-06-16 NOTE — Telephone Encounter (Signed)
Last seen in January 2018 for asthma and allergic rhinoconjunctivitis. Please advise on need for appointment.

## 2016-06-17 NOTE — Telephone Encounter (Signed)
Can you please schedule her an appt for Dr. Verlin Fester? Thank you!

## 2016-06-18 ENCOUNTER — Encounter: Payer: Self-pay | Admitting: Allergy and Immunology

## 2016-06-18 ENCOUNTER — Ambulatory Visit (INDEPENDENT_AMBULATORY_CARE_PROVIDER_SITE_OTHER): Payer: 59 | Admitting: Allergy and Immunology

## 2016-06-18 VITALS — BP 110/70 | HR 60 | Temp 98.0°F | Resp 16

## 2016-06-18 DIAGNOSIS — R490 Dysphonia: Secondary | ICD-10-CM | POA: Diagnosis not present

## 2016-06-18 DIAGNOSIS — J4530 Mild persistent asthma, uncomplicated: Secondary | ICD-10-CM | POA: Diagnosis not present

## 2016-06-18 DIAGNOSIS — J3089 Other allergic rhinitis: Secondary | ICD-10-CM

## 2016-06-18 MED ORDER — FLUTICASONE PROPIONATE HFA 110 MCG/ACT IN AERO: 1.0000 | INHALATION_SPRAY | Freq: Two times a day (BID) | RESPIRATORY_TRACT | 5 refills | Status: DC

## 2016-06-18 NOTE — Progress Notes (Signed)
Follow-up Note  RE: Alejandra Young MRN: [id number] DOB: Oct 11, 1955 Date of Office Visit: 06/18/2016  Primary care provider: Cari Caraway, MD Referring provider: Cari Caraway, MD  History of present illness: Alejandra Young is a 61 y.o. female with persistent asthma and allergic rhinitis presenting today for follow up.  She was last seen in this clinic on 01/28/2016.  She reports that she had been experiencing laryngitis, however after laryngoscopy by her oncologist she was started on Protonix for silent reflux.  After starting the proton pump inhibitor her laryngitis resolved.  However, she discontinued this medication and did not have any problems until late May when she began to one scan experience laryngitis.  She restarted the Protonix, however the symptoms did not improve.  She tried ipratropium nasal spray without benefit.  At around the same time that the laryngitis started, she had switched from Qvar HFA to [place].  She feels that her asthma has been well controlled.  She rarely requires albuterol rescue and denies nocturnal awakenings due to lower respiratory symptoms.  She is currently taking Qvar 80 g Redihaler, 2 inhalations twice a day.   Assessment and plan: Hoarseness of voice I believe that this is due to steroid deposition in the mouth/throat from the [place].  She will be switched to an [facility] inhaler which will be inhaled with a spacer device.  Mild persistent asthma Well-controlled, we will stepdown therapy at this time.    Discontinue Qvar RediHaler.  A prescription has been provided for Flovent 110 g, one inhalation via spacer device twice a day.  If lower respiratory symptoms progress in frequency and/or severity, the patient will take 2 inhalations via spacer device twice a day.  Continue albuterol HFA, 1-2 inhalations every 4-6 hours as needed.  Subjective and objective measures of pulmonary function will be followed and the treatment plan will be  adjusted accordingly.  Allergic rhinitis with nonallergic component  Continue appropriate allergen avoidance measures, and nasal saline irrigation as needed, and guaifenesin if needed.   Meds ordered this encounter  Medications  . fluticasone (FLOVENT HFA) 110 MCG/ACT inhaler    Sig: Inhale 1 puff into the lungs 2 (two) times daily. Use with spacer. Rinse, gargle and spit out after use    Dispense:  1 Inhaler    Refill:  5    Diagnostics: Spirometry:  Normal with an FEV1 of 98% predicted.  Please see scanned spirometry results for details.    Physical examination: Blood pressure 110/70, pulse 60, temperature 98 F (36.7 C), temperature source Oral, resp. rate 16, SpO2 98 %.  General: Alert, interactive, in no acute distress. HEENT: TMs pearly gray, turbinates minimally edematous without discharge, post-pharynx moderately erythematous. White/yellow plaques on the tongue. Neck: Supple without lymphadenopathy. Lungs: Clear to auscultation without wheezing, rhonchi or rales. CV: Normal S1, S2 without murmurs. Skin: Warm and dry, without lesions or rashes.  The following portions of the patient's history were reviewed and updated as appropriate: allergies, current medications, past family history, past medical history, past social history, past surgical history and problem list.  Allergies as of 06/18/2016      Reactions   Codeine Anxiety      Medication List       Accurate as of 06/18/16  6:57 PM. Always use your most recent med list.          albuterol 108 (90 Base) MCG/ACT inhaler Commonly known as:  PROAIR HFA Inhale 1-2 puffs into the lungs every  6 (six) hours as needed for wheezing or shortness of breath.   beclomethasone 80 MCG/ACT inhaler Commonly known as:  QVAR INHALE TWO PUFFS TWICE DAILY TO PREVENT COUGH OR WHEEZE. RINSE MOUTH AFTER USE.   Beclomethasone Dipropionate 80 MCG/ACT Aers Commonly known as:  QNASL Place 2 sprays into both nostrils 1 day or 1  dose.   carisoprodol 350 MG tablet Commonly known as:  SOMA   clonazePAM 0.5 MG tablet Commonly known as:  KLONOPIN Take 0.5 mg by mouth 2 (two) times daily as needed for anxiety.   escitalopram 10 MG tablet Commonly known as:  LEXAPRO Take 10 mg by mouth daily.   estradiol 0.1 MG/24HR patch Commonly known as:  VIVELLE-DOT Place 1 patch onto the skin 2 (two) times a week.   fluticasone 110 MCG/ACT inhaler Commonly known as:  FLOVENT HFA Inhale 1 puff into the lungs 2 (two) times daily. Use with spacer. Rinse, gargle and spit out after use   fluticasone 50 MCG/ACT nasal spray Commonly known as:  FLONASE Place 1 spray into both nostrils 2 (two) times daily as needed for allergies or rhinitis.   meloxicam 7.5 MG tablet Commonly known as:  MOBIC   pantoprazole 20 MG tablet Commonly known as:  PROTONIX Take 20 mg by mouth daily.   sulfamethoxazole-trimethoprim 200-40 MG/5ML suspension Commonly known as:  BACTRIM,SEPTRA Take 200 mg by mouth as needed.       Allergies  Allergen Reactions  . Codeine Anxiety    I appreciate the opportunity to take part in Anadelia's care. Please do not hesitate to contact me with questions.  Sincerely,   R. Edgar Frisk, MD

## 2016-06-18 NOTE — Assessment & Plan Note (Signed)
   Continue appropriate allergen avoidance measures, and nasal saline irrigation as needed, and guaifenesin if needed. 

## 2016-06-18 NOTE — Patient Instructions (Signed)
Hoarseness of voice I believe that this is due to steroid deposition in the mouth/throat from the [place].  She will be switched to an [facility] inhaler which will be inhaled with a spacer device.  Mild persistent asthma Well-controlled, we will stepdown therapy at this time.    Discontinue Qvar RediHaler.  A prescription has been provided for Flovent 110 g, one inhalation via spacer device twice a day.  If lower respiratory symptoms progress in frequency and/or severity, the patient will take 2 inhalations via spacer device twice a day.  Continue albuterol HFA, 1-2 inhalations every 4-6 hours as needed.  Subjective and objective measures of pulmonary function will be followed and the treatment plan will be adjusted accordingly.  Allergic rhinitis with nonallergic component  Continue appropriate allergen avoidance measures, and nasal saline irrigation as needed, and guaifenesin if needed.   Return in about 6 months (around 12/18/2016), or if symptoms worsen or fail to improve.

## 2016-06-18 NOTE — Assessment & Plan Note (Signed)
I believe that this is due to steroid deposition in the mouth/throat from the [place].  She will be switched to an [facility] inhaler which will be inhaled with a spacer device.

## 2016-06-18 NOTE — Assessment & Plan Note (Signed)
Well-controlled, we will stepdown therapy at this time.    Discontinue Qvar RediHaler.  A prescription has been provided for Flovent 110 g, one inhalation via spacer device twice a day.  If lower respiratory symptoms progress in frequency and/or severity, the patient will take 2 inhalations via spacer device twice a day.  Continue albuterol HFA, 1-2 inhalations every 4-6 hours as needed.  Subjective and objective measures of pulmonary function will be followed and the treatment plan will be adjusted accordingly.

## 2016-06-19 NOTE — Telephone Encounter (Signed)
-----   Message from Adelina Mings, MD sent at 06/18/2016  6:54 PM EDT ----- It appears that the patient's insurance may not cover Flovent.  Please find out what inhaled corticosteroids are covered by her insurance, preferably HFA inhalers.  Also, please ask her not to pick up the [place] (which has already been called into pharmacy) if it is too expensive. Thanks.

## 2016-06-19 NOTE — Telephone Encounter (Signed)
Patient called back and has been advised of medication issue. She said that the Asmanex inhaler given would last her awhile anyway.

## 2016-06-19 NOTE — Telephone Encounter (Signed)
I called the patient and received no answer.

## 2016-06-29 ENCOUNTER — Telehealth: Payer: Self-pay

## 2016-06-29 MED ORDER — MOMETASONE FUROATE 100 MCG/ACT IN AERO: 2.0000 | INHALATION_SPRAY | Freq: Every day | RESPIRATORY_TRACT | 5 refills | Status: DC

## 2016-06-29 NOTE — Telephone Encounter (Signed)
-----   Message from Adelina Mings, MD sent at 06/29/2016  8:43 AM EDT ----- Asmanex 100 mcg, 2 puffs vis spacer device bid. Thanks.  ----- Message ----- From: Lucrezia Starch I, CMA Sent: 06/19/2016   7:55 AM To: Adelina Mings, MD  [place] and Asmanex inhalers are listed as tier 1.  ----- Message ----- From: Adelina Mings, MD Sent: 06/18/2016   6:54 PM To: Jaquita [facility]olds [facility], [facility]  It appears that the patient's insurance may not cover Flovent.  Please find out what inhaled corticosteroids are covered by her insurance, preferably HFA inhalers.  Also, please ask her not to pick up the [place] (which has already been called into pharmacy) if it is too expensive. Thanks.

## 2016-06-29 NOTE — Telephone Encounter (Signed)
I spoke with patient last week and she informed me that she has enough in her current inhaler to last about 30 days. I have sent in the new prescription per Dr. Verlin Fester.

## 2016-07-01 ENCOUNTER — Telehealth: Payer: Self-pay

## 2016-07-01 NOTE — Telephone Encounter (Signed)
She should continue to use ipratropium and take Mucinex 1200 mg with plenty of water twice a day.  Often times, in the first 24-48 hours the symptoms may seem to be a little bit worse because as the mucus goes from thick to thin, it tends to move more down the throat while semi-viscus.

## 2016-07-01 NOTE — Telephone Encounter (Signed)
FYI  Spoke with Patient and gave information. Patient went to see Dr. Janace Hoard today he put her back on Protonix and an abx.  Patient will stay on asmanex HFA.

## 2016-07-01 NOTE — Telephone Encounter (Signed)
Patient wanted to update you on her status:  Patient is having laryngitis, throat clearing and mucus in throat.  She has an appointment with Dr. Janace Hoard, [facility], on 07/15/16.  Patients states her asthma is much improved with Asmanex 100 mcg 2 puffs with spacer BID.    Patient is using spacer and rinsing, gargling after use.  She wanted to let you know about these symptoms.  She has mucus in throat, sinus pressure and constant "glob of mucus in throat". Patient took Mucinex and Ipratropium nasal spray yesterday.  Her voice seems worse today.  Should patient continue taking these medications?  None of her medications seem to help with all the mucus.  Patient is going to try to get in with Dr. Janace Hoard today if possible.

## 2016-07-01 NOTE — Telephone Encounter (Signed)
Left message

## 2016-08-11 DIAGNOSIS — R490 Dysphonia: Secondary | ICD-10-CM | POA: Insufficient documentation

## 2016-08-11 DIAGNOSIS — J384 Edema of larynx: Secondary | ICD-10-CM | POA: Insufficient documentation

## 2016-08-11 DIAGNOSIS — J382 Nodules of vocal cords: Secondary | ICD-10-CM | POA: Insufficient documentation

## 2016-09-08 ENCOUNTER — Telehealth: Payer: Self-pay | Admitting: *Deleted

## 2016-09-08 MED ORDER — MOMETASONE FUROATE 100 MCG/ACT IN AERO: 2.0000 | INHALATION_SPRAY | Freq: Every day | RESPIRATORY_TRACT | 5 refills | Status: DC

## 2016-09-08 NOTE — Telephone Encounter (Signed)
Pt saw [facility] and asmanex was sent to wrong pharmacy, needs it to go to [facility]. Med sent. Pt has been using the samples we gave her and she is finally out.

## 2016-09-24 ENCOUNTER — Telehealth: Payer: Self-pay

## 2016-09-24 ENCOUNTER — Other Ambulatory Visit: Payer: Self-pay

## 2016-09-24 NOTE — Telephone Encounter (Signed)
You tell me. Check with her insurance. Thanks.

## 2016-09-24 NOTE — Telephone Encounter (Signed)
pts pharmacy called an stated that the asmanex is over $200, is there anything else we can try to send in for her?  Insurance formulary says that Flovent and arnuity are tier 1's  Please Advise.

## 2016-09-24 NOTE — Telephone Encounter (Signed)
Thanks. Flovent 110 mcg.

## 2016-09-24 NOTE — Telephone Encounter (Signed)
I put in my original message that flovent and arnuity are tier 1's  Please advise

## 2016-09-25 ENCOUNTER — Other Ambulatory Visit: Payer: Self-pay

## 2016-09-25 MED ORDER — FLUTICASONE PROPIONATE HFA 110 MCG/ACT IN AERO: 2.0000 | INHALATION_SPRAY | Freq: Two times a day (BID) | RESPIRATORY_TRACT | 5 refills | Status: DC

## 2016-09-25 NOTE — Telephone Encounter (Signed)
Informed pt of me sending in the flovnet

## 2016-09-25 NOTE — Telephone Encounter (Signed)
Sent in flovent will notify pt

## 2016-11-25 ENCOUNTER — Ambulatory Visit: Payer: 59 | Admitting: Allergy and Immunology

## 2016-11-25 ENCOUNTER — Encounter: Payer: Self-pay | Admitting: Allergy and Immunology

## 2016-11-25 VITALS — BP 110/58 | HR 64 | Temp 97.9°F | Resp 20 | Ht 64.8 in | Wt 115.2 lb

## 2016-11-25 DIAGNOSIS — J4530 Mild persistent asthma, uncomplicated: Secondary | ICD-10-CM | POA: Diagnosis not present

## 2016-11-25 DIAGNOSIS — R0789 Other chest pain: Secondary | ICD-10-CM

## 2016-11-25 DIAGNOSIS — R0609 Other forms of dyspnea: Secondary | ICD-10-CM | POA: Insufficient documentation

## 2016-11-25 MED ORDER — MOMETASONE FUROATE 100 MCG/ACT IN AERO: 2.0000 | INHALATION_SPRAY | Freq: Two times a day (BID) | RESPIRATORY_TRACT | 3 refills | Status: DC

## 2016-11-25 NOTE — Patient Instructions (Signed)
Mild persistent asthma  For now, continue Asmanex 100 g, 2 inhalations via spacer device daily. During respiratory tract infections or asthma flares, increase Asmanex 100g to 3 inhalations 3 times per day until symptoms have returned to baseline.  Continue albuterol HFA, 1-2 inhalations every 4-6 hours if needed and 15 minutes prior to vigorous exercise.  Subjective and objective measures of pulmonary function will be followed and the treatment plan will be adjusted accordingly.  Dyspnea The patients sensation of air-hunger, not being able to get a full breath on inspiration, which is relieved by a yawn suggests sighing dyspnea. Other less likely etiologies include vocal cord dysfunction. The patients spirometry today was normal.  Diaphragmatic breathing, or belly breathing, has been discussed with the patient as this technique often times relieves sighing dyspnea.  For now, continue to have access to albuterol HFA, if needed.  Other chest pain  Unclear etiology, may be secondary to asthma or musculoskeletal pain.  However, it may be prudent to rule out cardiac etiology as she does not recall the last time that she had a cardiac evaluation.   Return in about 4 months (around 03/25/2017), or if symptoms worsen or fail to improve.

## 2016-11-25 NOTE — Assessment & Plan Note (Signed)
   For now, continue Asmanex 100 g, 2 inhalations via spacer device daily. During respiratory tract infections or asthma flares, increase Asmanex 100g to 3 inhalations 3 times per day until symptoms have returned to baseline.  Continue albuterol HFA, 1-2 inhalations every 4-6 hours if needed and 15 minutes prior to vigorous exercise.  Subjective and objective measures of pulmonary function will be followed and the treatment plan will be adjusted accordingly.

## 2016-11-25 NOTE — Assessment & Plan Note (Addendum)
The patients sensation of air-hunger, not being able to get a full breath on inspiration, which is relieved by a yawn suggests sighing dyspnea. Other less likely etiologies include vocal cord dysfunction. The patients spirometry today was normal.  Diaphragmatic breathing, or belly breathing, has been discussed with the patient as this technique often times relieves sighing dyspnea.  For now, continue to have access to albuterol HFA, if needed.

## 2016-11-25 NOTE — Assessment & Plan Note (Signed)
   Unclear etiology, may be secondary to asthma or musculoskeletal pain.  However, it may be prudent to rule out cardiac etiology as she does not recall the last time that she had a cardiac evaluation.

## 2016-11-25 NOTE — Progress Notes (Signed)
Follow-up Note  RE: Alejandra Young MRN: [id number] DOB: [birth year] Date of Office Visit: 11/25/2016  Primary care provider: Cari Caraway, MD Referring provider: Cari Caraway, MD  History of present illness: Alejandra Young is a 61 y.o. female with a history of asthma and allergic rhinitis presenting today for sick visit.  She was last seen in this clinic on June 18, 2016.  She reports that over the past 2 weeks she has experienced some chest pain and chest tightness in the upper part of her chest.  She is uncertain of the symptoms are due to stress, or musculoskeletal pain from lifting weights, or from asthma.  She has no personal history of coronary artery disease, however is uncertain if she has ever had cardiac evaluation.  She states that at times she has difficulty getting a good deep breath in and fully expanding her lungs.  On occasion, she has a deep satisfying yawn which relieves the sense of dyspnea temporarily.  She denies wheezing or difficulty with exhalation.  She does become short of breath with vigorous exercise, such as boxing, and takes albuterol as needed and sometimes prior to vigorous exercise.  She is currently taking Asmanex 100 g, 2 inhalations via spacer device daily.  Flovent is less expensive, however she believes that this medication causes headaches so she prefers Asmanex.   Assessment and plan: Mild persistent asthma  For now, continue Asmanex 100 g, 2 inhalations via spacer device daily. During respiratory tract infections or asthma flares, increase Asmanex 100g to 3 inhalations 3 times per day until symptoms have returned to baseline.  Continue albuterol HFA, 1-2 inhalations every 4-6 hours if needed and 15 minutes prior to vigorous exercise.  Subjective and objective measures of pulmonary function will be followed and the treatment plan will be adjusted accordingly.  Dyspnea The patients sensation of air-hunger, not being able to get a full breath on  inspiration, which is relieved by a yawn suggests sighing dyspnea. Other less likely etiologies include vocal cord dysfunction. The patients spirometry today was normal.  Diaphragmatic breathing, or belly breathing, has been discussed with the patient as this technique often times relieves sighing dyspnea.  For now, continue to have access to albuterol HFA, if needed.  Other chest pain  Unclear etiology, may be secondary to asthma or musculoskeletal pain.  However, it may be prudent to rule out cardiac etiology as she does not recall the last time that she had a cardiac evaluation.   Meds ordered this encounter  Medications  . Mometasone Furoate (ASMANEX HFA) 100 MCG/ACT AERO    Sig: Inhale 2 puffs into the lungs 2 (two) times daily.    Dispense:  1 Inhaler    Refill:  3    Diagnostics: Spirometry:  Normal with an FEV1 of 110% predicted.  Please see scanned spirometry results for details.    Physical examination: Blood pressure (!) 110/58, pulse 64, temperature 97.9 F (36.6 C), temperature source Oral, resp. rate 20, height 5' 4.8" (1.646 m), weight 115 lb 3.2 oz (52.3 kg), SpO2 98 %.  General: Alert, interactive, in no acute distress. HEENT: TMs pearly gray, turbinates minimally edematous without discharge, post-pharynx unremarkable. Neck: Supple without lymphadenopathy. Lungs: Clear to auscultation without wheezing, rhonchi or rales. CV: Normal S1, S2 without murmurs. Skin: Warm and dry, without lesions or rashes.  The following portions of the patient's history were reviewed and updated as appropriate: allergies, current medications, past family history, past medical history, past social history, past  surgical history and problem list.  Allergies as of 11/25/2016      Reactions   Amoxicillin-pot Clavulanate Nausea And Vomiting   Codeine Anxiety, Other (See Comments)   anxiety      Medication List        Accurate as of 11/25/16  2:04 PM. Always use your most recent  med list.          albuterol 108 (90 Base) MCG/ACT inhaler Commonly known as:  PROAIR HFA Inhale 1-2 puffs into the lungs every 6 (six) hours as needed for wheezing or shortness of breath.   Beclomethasone Dipropionate 80 MCG/ACT Aers Commonly known as:  QNASL Place 2 sprays into both nostrils 1 day or 1 dose.   BIOFLEX PO Take 2 tablets by mouth daily.   carisoprodol 350 MG tablet Commonly known as:  SOMA   clobetasol 0.05 % external solution Commonly known as:  TEMOVATE APPLY TWICE DAILY TO AFFECTED AREAS ON SCALP AS NEEDED.   Clobetasol Propionate 0.05 % shampoo APPLY TO SCALP AT LEAST 3 TIMES A WEEK AS NEEDED FOR IRRITIATION   clonazePAM 0.5 MG tablet Commonly known as:  KLONOPIN Take 0.5 mg by mouth 2 (two) times daily as needed for anxiety.   escitalopram 20 MG tablet Commonly known as:  LEXAPRO Take by mouth.   estradiol 0.05 MG/24HR patch Commonly known as:  VIVELLE-DOT   fluticasone 50 MCG/ACT nasal spray Commonly known as:  FLONASE Place 1 spray into both nostrils 2 (two) times daily as needed for allergies or rhinitis.   meloxicam 7.5 MG tablet Commonly known as:  MOBIC   Mometasone Furoate 100 MCG/ACT Aero Commonly known as:  ASMANEX HFA Inhale 2 puffs into the lungs 2 (two) times daily.   pantoprazole 40 MG tablet Commonly known as:  PROTONIX Take 40 mg by mouth.   sulfamethoxazole-trimethoprim 200-40 MG/5ML suspension Commonly known as:  BACTRIM,SEPTRA Take 200 mg by mouth as needed.       Allergies  Allergen Reactions  . Amoxicillin-Pot Clavulanate Nausea And Vomiting  . Codeine Anxiety and Other (See Comments)    anxiety   Review of systems: Review of systems negative except as noted in HPI / PMHx or noted below: Constitutional: Negative.  HENT: Negative.   Eyes: Negative.  Respiratory: Negative.   Cardiovascular: Negative.  Gastrointestinal: Negative.  Genitourinary: Negative.  Musculoskeletal: Negative.  Neurological:  Negative.  Endo/Heme/Allergies: Negative.  Cutaneous: Negative.  Past Medical History:  Diagnosis Date  . Anxiety   . Asthma   . Sinusitis, acute     Family History  Problem Relation Age of Onset  . Allergic rhinitis Neg Hx   . Angioedema Neg Hx   . Asthma Neg Hx   . Eczema Neg Hx   . Urticaria Neg Hx   . Immunodeficiency Neg Hx     Social History   Socioeconomic History  . Marital status: Married    Spouse name: Not on file  . Number of children: Not on file  . Years of education: Not on file  . Highest education level: Not on file  Social Needs  . Financial resource strain: Not on file  . Food insecurity - worry: Not on file  . Food insecurity - inability: Not on file  . Transportation needs - medical: Not on file  . Transportation needs - non-medical: Not on file  Occupational History  . Not on file  Tobacco Use  . Smoking status: Former Smoker    Packs/day: 1.00  Years: 10.00    Pack years: 10.00    Types: Cigarettes  . Smokeless tobacco: Never Used  Substance and Sexual Activity  . Alcohol use: No  . Drug use: No  . Sexual activity: Not on file  Other Topics Concern  . Not on file  Social History Narrative  . Not on file    I appreciate the opportunity to take part in Alejandra Young's care. Please do not hesitate to contact me with questions.  Sincerely,   R. Edgar Frisk, MD

## 2017-07-21 ENCOUNTER — Telehealth: Payer: Self-pay | Admitting: Allergy and Immunology

## 2017-07-21 NOTE — Telephone Encounter (Signed)
Pt called and wanted to know if she needs to refill her asmanex because she dont think she needs it.

## 2017-07-21 NOTE — Telephone Encounter (Signed)
An appointment has been made for Alejandra Young so that she can be assessed to determine continued need for Asmanex.

## 2017-07-28 ENCOUNTER — Telehealth: Payer: Self-pay | Admitting: Allergy and Immunology

## 2017-07-28 NOTE — Telephone Encounter (Signed)
Called and spoke with patient and informed her that since her OV is to assess how the Asmanex has been working we do want her to continue to use it. So I have place a sample up front for patient to pick up and patient is aware.

## 2017-07-28 NOTE — Telephone Encounter (Signed)
Patient tried to fill her Asmanex and her pharmacy told her it was on backorder. She has an appointment August 5th, with Dr. Verlin Fester. She said the Asmanex has really helped control her asthma, but wants to know if she should just not take anything until her appointment, or if there is an alternative she can take. [employer].

## 2017-08-09 ENCOUNTER — Ambulatory Visit: Payer: Managed Care, Other (non HMO) | Admitting: Allergy and Immunology

## 2017-08-09 ENCOUNTER — Encounter: Payer: Self-pay | Admitting: Allergy and Immunology

## 2017-08-09 VITALS — BP 102/50 | HR 55 | Resp 20

## 2017-08-09 DIAGNOSIS — J4530 Mild persistent asthma, uncomplicated: Secondary | ICD-10-CM

## 2017-08-09 DIAGNOSIS — J3089 Other allergic rhinitis: Secondary | ICD-10-CM

## 2017-08-09 MED ORDER — ALBUTEROL SULFATE HFA 108 (90 BASE) MCG/ACT IN AERS: 1.0000 | INHALATION_SPRAY | Freq: Four times a day (QID) | RESPIRATORY_TRACT | 1 refills | Status: DC | PRN

## 2017-08-09 NOTE — Progress Notes (Signed)
Follow-up Note  RE: Alejandra Young MRN: [id number] DOB: [birth year] Date of Office Visit: 08/09/2017  Primary care provider: Cari Caraway, MD Referring provider: Cari Caraway, MD  History of present illness: Alejandra Young is a 62 y.o. female with persistent asthma and allergic rhinitis presenting today for follow-up.  She was last seen in this clinic in January 2018.  She reports that in the interval since her previous visit her asthma has been well controlled with Asmanex 110 g, 1 inhalation via spacer device daily.  She rarely requires albuterol rescue and does not experience limitations in normal daily activities or nocturnal awakenings due to lower respiratory symptoms.  Her nasal allergy symptoms have been well controlled in the interval since her previous visit.  Assessment and plan: Mild persistent asthma  For now, continue Asmanex 100 g, 1 inhalation via spacer device daily.  If symptoms are well controlled for the next few weeks, may attempt a trial off of Asmanex.    During respiratory tract infections or asthma flares, add Asmanex 100g 2 inhalations 2 times per day until symptoms have returned to baseline.  Continue albuterol HFA, 1 to 2 inhalations every 6 hours if needed and 15 minutes prior to vigorous exercise  Subjective and objective measures of pulmonary function will be followed and the treatment plan will be adjusted accordingly.  Allergic rhinitis with nonallergic component  Continue appropriate allergen avoidance measures, and nasal saline irrigation as needed, and guaifenesin if needed.   Meds ordered this encounter  Medications  . albuterol (PROAIR HFA) 108 (90 Base) MCG/ACT inhaler    Sig: Inhale 1-2 puffs into the lungs every 6 (six) hours as needed for wheezing or shortness of breath.    Dispense:  18 g    Refill:  1    Diagnostics: Spirometry:  Normal with an FEV1 of 99% predicted.  Please see scanned spirometry results for details.     Physical examination: Blood pressure (!) 102/50, pulse (!) 55, resp. rate 20, SpO2 98 %.  General: Alert, interactive, in no acute distress. HEENT: TMs pearly gray, turbinates minimally edematous without discharge, post-pharynx mildly erythematous. Neck: Supple without lymphadenopathy. Lungs: Clear to auscultation without wheezing, rhonchi or rales. CV: Normal S1, S2 without murmurs. Skin: Warm and dry, without lesions or rashes.  The following portions of the patient's history were reviewed and updated as appropriate: allergies, current medications, past family history, past medical history, past social history, past surgical history and problem list.  Allergies as of 08/09/2017      Reactions   Amoxicillin-pot Clavulanate Nausea And Vomiting   Codeine Anxiety, Other (See Comments)   anxiety      Medication List        Accurate as of 08/09/17  6:55 PM. Always use your most recent med list.          albuterol 108 (90 Base) MCG/ACT inhaler Commonly known as:  PROAIR HFA Inhale 1-2 puffs into the lungs every 6 (six) hours as needed for wheezing or shortness of breath.   BIOFLEX PO Take 2 tablets by mouth daily.   clonazePAM 0.5 MG tablet Commonly known as:  KLONOPIN Take 0.5 mg by mouth 2 (two) times daily as needed for anxiety.   escitalopram 20 MG tablet Commonly known as:  LEXAPRO Take by mouth.   estradiol 0.05 MG/24HR patch Commonly known as:  VIVELLE-DOT   fluticasone 50 MCG/ACT nasal spray Commonly known as:  FLONASE Place 1 spray into both nostrils 2 (two)  times daily as needed for allergies or rhinitis.   meloxicam 7.5 MG tablet Commonly known as:  MOBIC   Mometasone Furoate 100 MCG/ACT Aero Commonly known as:  ASMANEX HFA Inhale 2 puffs into the lungs 2 (two) times daily.   sulfamethoxazole-trimethoprim 200-40 MG/5ML suspension Commonly known as:  BACTRIM,SEPTRA Take 200 mg by mouth as needed.       Allergies  Allergen Reactions  .  Amoxicillin-Pot Clavulanate Nausea And Vomiting  . Codeine Anxiety and Other (See Comments)    anxiety    I appreciate the opportunity to take part in Alejandra Young's care. Please do not hesitate to contact me with questions.  Sincerely,   R. Edgar Frisk, MD

## 2017-08-09 NOTE — Assessment & Plan Note (Signed)
   Continue appropriate allergen avoidance measures, and nasal saline irrigation as needed, and guaifenesin if needed.

## 2017-08-09 NOTE — Patient Instructions (Addendum)
Mild persistent asthma  For now, continue Asmanex 100 g, 1 inhalation via spacer device daily.  If symptoms are well controlled for the next few weeks, may attempt a trial off of Asmanex.    During respiratory tract infections or asthma flares, add Asmanex 100g 2 inhalations 2 times per day until symptoms have returned to baseline.  Continue albuterol HFA, 1 to 2 inhalations every 6 hours if needed and 15 minutes prior to vigorous exercise  Subjective and objective measures of pulmonary function will be followed and the treatment plan will be adjusted accordingly.  Allergic rhinitis with nonallergic component  Continue appropriate allergen avoidance measures, and nasal saline irrigation as needed, and guaifenesin if needed.   Return in about 6 months (around 02/09/2018), or if symptoms worsen or fail to improve.

## 2017-08-09 NOTE — Assessment & Plan Note (Signed)
   For now, continue Asmanex 100 g, 1 inhalation via spacer device daily.  If symptoms are well controlled for the next few weeks, may attempt a trial off of Asmanex.    During respiratory tract infections or asthma flares, add Asmanex 100g 2 inhalations 2 times per day until symptoms have returned to baseline.  Continue albuterol HFA, 1 to 2 inhalations every 6 hours if needed and 15 minutes prior to vigorous exercise  Subjective and objective measures of pulmonary function will be followed and the treatment plan will be adjusted accordingly.

## 2017-09-10 ENCOUNTER — Ambulatory Visit: Payer: Managed Care, Other (non HMO) | Admitting: Cardiovascular Disease

## 2017-11-29 ENCOUNTER — Encounter (HOSPITAL_BASED_OUTPATIENT_CLINIC_OR_DEPARTMENT_OTHER): Payer: Self-pay

## 2017-11-29 ENCOUNTER — Emergency Department (HOSPITAL_BASED_OUTPATIENT_CLINIC_OR_DEPARTMENT_OTHER)
Admission: EM | Admit: 2017-11-29 | Discharge: 2017-11-29 | Disposition: A | Discharge status: Home / Self Care | Payer: Managed Care, Other (non HMO) | Attending: Emergency Medicine | Admitting: Emergency Medicine

## 2017-11-29 ENCOUNTER — Other Ambulatory Visit: Payer: Self-pay

## 2017-11-29 DIAGNOSIS — F419 Anxiety disorder, unspecified: Secondary | ICD-10-CM | POA: Insufficient documentation

## 2017-11-29 DIAGNOSIS — Z23 Encounter for immunization: Secondary | ICD-10-CM | POA: Insufficient documentation

## 2017-11-29 DIAGNOSIS — S01511A Laceration without foreign body of lip, initial encounter: Secondary | ICD-10-CM | POA: Insufficient documentation

## 2017-11-29 DIAGNOSIS — Z87891 Personal history of nicotine dependence: Secondary | ICD-10-CM | POA: Insufficient documentation

## 2017-11-29 DIAGNOSIS — Y929 Unspecified place or not applicable: Secondary | ICD-10-CM | POA: Insufficient documentation

## 2017-11-29 DIAGNOSIS — J45909 Unspecified asthma, uncomplicated: Secondary | ICD-10-CM | POA: Insufficient documentation

## 2017-11-29 DIAGNOSIS — Y9389 Activity, other specified: Secondary | ICD-10-CM | POA: Insufficient documentation

## 2017-11-29 DIAGNOSIS — W228XXA Striking against or struck by other objects, initial encounter: Secondary | ICD-10-CM | POA: Insufficient documentation

## 2017-11-29 DIAGNOSIS — Y998 Other external cause status: Secondary | ICD-10-CM | POA: Insufficient documentation

## 2017-11-29 MED ORDER — TETANUS-DIPHTH-ACELL PERTUSSIS 5-2.5-18.5 LF-MCG/0.5 IM SUSP
0.5000 mL | Freq: Once | INTRAMUSCULAR | Status: AC
  Administered 2017-11-29: 0.5 mL via INTRAMUSCULAR
  Filled 2017-11-29: qty 0.5

## 2017-11-29 MED ORDER — NAPROXEN 250 MG PO TABS
500.0000 mg | ORAL_TABLET | Freq: Once | ORAL | Status: AC
  Administered 2017-11-29: 500 mg via ORAL
  Filled 2017-11-29: qty 2

## 2017-11-29 MED ORDER — LIDOCAINE-EPINEPHRINE-TETRACAINE (LET) SOLUTION
3.0000 mL | Freq: Once | NASAL | Status: AC
  Administered 2017-11-29: 3 mL via TOPICAL
  Filled 2017-11-29: qty 3

## 2017-11-29 MED ORDER — SULFAMETHOXAZOLE-TRIMETHOPRIM 800-160 MG PO TABS: 1.0000 | ORAL_TABLET | Freq: Two times a day (BID) | ORAL | 0 refills | Status: AC

## 2017-11-29 NOTE — Discharge Instructions (Addendum)
You were evaluated in the Emergency Department and after careful evaluation, we did not find any emergent condition requiring admission or further testing in the hospital.  Your symptoms today seem to be due to a lip laceration, which was repaired here in the emergency department.  The sutures will need to be removed by healthcare professional in 3 to 5 days.  Please take the antibiotics provided as directed.  Please return to the Emergency Department if you experience any worsening of your condition.  We encourage you to follow up with a primary care provider.  Thank you for allowing [place] to be a part of your care.

## 2017-11-29 NOTE — ED Notes (Signed)
ED Provider at bedside. Suturing pt.

## 2017-11-29 NOTE — ED Provider Notes (Signed)
[facility] Emergency Department Provider Note MRN:  [id number]  Arrival date & time: 11/30/17     Chief Complaint   Lip Laceration   History of Present Illness   Alejandra Young is a 62 y.o. year-old female with no pertinent past medical history presenting to the ED with chief complaint of lip laceration.  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  Was initially evaluated in urgent care, sent here for suture repair of the lip.  Denies any other injuries, last tetanus shot about 7 to 8 years ago.  Pain in the lip is mild, constant, worse with smiling.  Review of Systems  A complete 10 system review of systems was obtained and all systems are negative except as noted in the HPI and PMH.   Patient's Health History    Past Medical History:  Diagnosis Date  . Anxiety   . Asthma   . Sinusitis, acute     Past Surgical History:  Procedure Laterality Date  . ABDOMINAL HYSTERECTOMY    . LARYNGOSCOPY      Family History  Problem Relation Age of Onset  . Allergic rhinitis Neg Hx   . Angioedema Neg Hx   . Asthma Neg Hx   . Eczema Neg Hx   . Urticaria Neg Hx   . Immunodeficiency Neg Hx     Social History   Socioeconomic History  . Marital status: Married    Spouse name: Not on file  . Number of children: Not on file  . Years of education: Not on file  . Highest education level: Not on file  Occupational History  . Not on file  Social Needs  . Financial resource strain: Not on file  . Food insecurity:    Worry: Not on file    Inability: Not on file  . Transportation needs:    Medical: Not on file    Non-medical: Not on file  Tobacco Use  . Smoking status: Former Smoker    Packs/day: 1.00    Years: 10.00    Pack years: 10.00    Types: Cigarettes  . Smokeless tobacco: Never Used  Substance and Sexual Activity  . Alcohol use: No  . Drug use: No  .  Sexual activity: Not on file  Lifestyle  . Physical activity:    Days per week: Not on file    Minutes per session: Not on file  . Stress: Not on file  Relationships  . Social connections:    Talks on phone: Not on file    Gets together: Not on file    Attends religious service: Not on file    Active member of club or organization: Not on file    Attends meetings of clubs or organizations: Not on file    Relationship status: Not on file  . Intimate partner violence:    Fear of current or ex partner: Not on file    Emotionally abused: Not on file    Physically abused: Not on file    Forced sexual activity: Not on file  Other Topics Concern  . Not on file  Social History Narrative  . Not on file     Physical Exam  Vital Signs and Nursing Notes reviewed Vitals:   11/29/17 2100 11/29/17 2130  BP: 116/66 121/73  Pulse: (!) 50 (!) 47  Resp: 18   Temp:  SpO2: 100% 100%    CONSTITUTIONAL: Well-appearing, NAD NEURO:  Alert and oriented x 3, no focal deficits EYES:  eyes equal and reactive ENT/NECK:  no LAD, no JVD CARDIO: Regular rate, well-perfused, normal S1 and S2 PULM:  CTAB no wheezing or rhonchi GI/GU:  normal bowel sounds, non-distended, non-tender MSK/SPINE:  No gross deformities, no edema SKIN:  no rash, 1 cm superficial lower lip laceration that begins at the dry wet border and extends through the vermilion border [facility]:  Appropriate speech and behavior  Diagnostic and Interventional Summary    Labs Reviewed - No data to display  No orders to display    Medications  lidocaine-EPINEPHrine-tetracaine (LET) solution (3 mLs Topical Given 11/29/17 2011)  Tdap (BOOSTRIX) injection 0.5 mL (0.5 mLs Intramuscular Given 11/29/17 2010)  naproxen (NAPROSYN) tablet 500 mg (500 mg Oral Given 11/29/17 2127)     .Marland KitchenLaceration Repair Date/Time: 11/30/2017 12:09 AM Performed by: Maudie Flakes, MD Authorized by: Maudie Flakes, MD   Consent:    Consent obtained:   Verbal   Consent given by:  Patient   Risks discussed:  Poor cosmetic result, infection and pain Anesthesia (see MAR for exact dosages):    Anesthesia method:  Topical application   Topical anesthetic:  LET Laceration details:    Location:  Lip   Lip location:  Lower exterior lip   Length (cm):  2   Depth (mm):  2 Repair type:    Repair type:  Simple Pre-procedure details:    Preparation:  Patient was prepped and draped in usual sterile fashion Exploration:    Hemostasis achieved with:  LET   Wound exploration: entire depth of wound probed and visualized     Contaminated: no   Treatment:    Area cleansed with:  Saline   Amount of cleaning:  Standard   Irrigation method:  Syringe Skin repair:    Repair method:  Sutures   Suture size:  5-0   Suture material:  Prolene   Number of sutures:  4 Approximation:    Approximation:  Close   Vermilion border: well-aligned   Post-procedure details:    Dressing:  Antibiotic ointment and non-adherent dressing   Patient tolerance of procedure:  Tolerated well, no immediate complications   Critical Care  ED Course and Medical Decision Making  I have reviewed the triage vital signs and the nursing notes.  Pertinent labs & imaging results that were available during my care of the patient were reviewed by me and considered in my medical decision making (see below for details).  We will update tetanus, repair laceration, provide antibiotic prophylaxis given the dirty nature of the wound.  Repaired as described above, prescription for Bactrim to cover for Aeromonas given the fresh water exposure to the wound.  After the discussed management above, the patient was determined to be safe for discharge.  The patient was in agreement with this plan and all questions regarding their care were answered.  ED return precautions were discussed and the patient will return to the ED with any significant worsening of condition.  Barth Kirks. Sedonia Small,  [place] [email or web address]  Final Clinical Impressions(s) / ED Diagnoses     ICD-10-CM   1. Lip laceration, initial encounter S01.511A     ED Discharge Orders         Ordered    sulfamethoxazole-trimethoprim (BACTRIM DS,SEPTRA DS) 800-160 MG tablet  2 times daily     11/29/17 2121  Maudie Flakes, MD 11/30/17 [id number]

## 2017-11-29 NOTE — ED Triage Notes (Signed)
Pt states she cut her bottom lip on a plastic fish pond filter ~3pm-lac noted to left corner-no bleeding-NAD-steady gait

## 2017-11-29 NOTE — ED Notes (Signed)
LET taped in place, suture cart at bedside, and TDAP administered.

## 2018-02-14 ENCOUNTER — Ambulatory Visit: Payer: Managed Care, Other (non HMO) | Admitting: Allergy and Immunology

## 2018-10-17 ENCOUNTER — Encounter: Payer: Self-pay | Admitting: Allergy and Immunology

## 2018-10-17 ENCOUNTER — Other Ambulatory Visit: Payer: Self-pay

## 2018-10-17 ENCOUNTER — Ambulatory Visit: Payer: Managed Care, Other (non HMO) | Admitting: Allergy and Immunology

## 2018-10-17 VITALS — BP 108/58 | HR 57 | Temp 97.8°F | Resp 18 | Ht 64.5 in | Wt 117.2 lb

## 2018-10-17 DIAGNOSIS — J3089 Other allergic rhinitis: Secondary | ICD-10-CM

## 2018-10-17 DIAGNOSIS — R059 Cough, unspecified: Secondary | ICD-10-CM

## 2018-10-17 DIAGNOSIS — J4530 Mild persistent asthma, uncomplicated: Secondary | ICD-10-CM | POA: Diagnosis not present

## 2018-10-17 DIAGNOSIS — R05 Cough: Secondary | ICD-10-CM

## 2018-10-17 MED ORDER — ASMANEX HFA 100 MCG/ACT IN AERO: 2.0000 | INHALATION_SPRAY | Freq: Two times a day (BID) | RESPIRATORY_TRACT | 5 refills | Status: DC | PRN

## 2018-10-17 MED ORDER — AZELASTINE HCL 0.15 % NA SOLN: 1.0000 | Freq: Two times a day (BID) | NASAL | 5 refills | Status: DC | PRN

## 2018-10-17 MED ORDER — ALBUTEROL SULFATE HFA 108 (90 BASE) MCG/ACT IN AERS: 1.0000 | INHALATION_SPRAY | Freq: Four times a day (QID) | RESPIRATORY_TRACT | 1 refills | Status: AC | PRN

## 2018-10-17 NOTE — Assessment & Plan Note (Signed)
Most likely multifactorial with contribution from postnasal drainage and bronchial hyperresponsiveness.  Treatment plan as outlined above.

## 2018-10-17 NOTE — Patient Instructions (Addendum)
Mild persistent asthma  For now and during respiratory tract infections or asthma flares, add Asmanex 100g 2 inhalations 2 times per day until symptoms have returned to baseline.  Continue albuterol HFA, 1 to 2 inhalations every 6 hours if needed and 15 minutes prior to vigorous exercise  Subjective and objective measures of pulmonary function will be followed and the treatment plan will be adjusted accordingly.  Allergic rhinitis with nonallergic component  Continue appropriate allergen avoidance measures.  A prescription has been provided for azelastine nasal spray, 1-2 sprays per nostril 2 times daily as needed.  Nasal saline spray (i.e., Simply Saline) or nasal saline lavage (i.e., NeilMed) is recommended as needed and prior to medicated nasal sprays.  For thick post nasal drainage, add guaifenesin [phone] mg (Mucinex)  twice daily as needed with adequate hydration as discussed.  Coughing Most likely multifactorial with contribution from postnasal drainage and bronchial hyperresponsiveness.  Treatment plan as outlined above.   Return in about 5 months (around 03/17/2019), or if symptoms worsen or fail to improve.

## 2018-10-17 NOTE — Assessment & Plan Note (Signed)
   For now and during respiratory tract infections or asthma flares, add Asmanex 100g 2 inhalations 2 times per day until symptoms have returned to baseline.  Continue albuterol HFA, 1 to 2 inhalations every 6 hours if needed and 15 minutes prior to vigorous exercise  Subjective and objective measures of pulmonary function will be followed and the treatment plan will be adjusted accordingly.

## 2018-10-17 NOTE — Progress Notes (Addendum)
Follow-up Note  RE: KATHERINA BALLOG MRN: [id number] DOB: [birth year] Date of Office Visit: 10/17/2018  Primary care provider: Cari Caraway, MD Referring provider: Cari Caraway, MD  History of present illness: Alejandra Young is a 63 y.o. female with persistent asthma, allergic rhinitis, and history of persistent cough.  She reports that over the past 2 weeks her "asthma cough" returned.  The cough is described as dry and hacking.  The cough seems to originate in the upper chest and base of the throat.  She reports that she attempted to control the cough with Asmanex, however notes that the Asmanex was expired.  She denies fevers, chills, and discolored mucus production.  Other than some postnasal drainage, she is not experiencing significant nasal allergy symptoms.  Assessment and plan: Mild persistent asthma  For now and during respiratory tract infections or asthma flares, add Asmanex 100g 2 inhalations 2 times per day until symptoms have returned to baseline.  Continue albuterol HFA, 1 to 2 inhalations every 6 hours if needed and 15 minutes prior to vigorous exercise  Subjective and objective measures of pulmonary function will be followed and the treatment plan will be adjusted accordingly.  Allergic rhinitis with nonallergic component  Continue appropriate allergen avoidance measures.  A prescription has been provided for azelastine nasal spray, 1-2 sprays per nostril 2 times daily as needed.  Nasal saline spray (i.e., Simply Saline) or nasal saline lavage (i.e., NeilMed) is recommended as needed and prior to medicated nasal sprays.  For thick post nasal drainage, add guaifenesin [phone] mg (Mucinex)  twice daily as needed with adequate hydration as discussed.  Coughing Most likely multifactorial with contribution from postnasal drainage and bronchial hyperresponsiveness.  Treatment plan as outlined above.   Meds ordered this encounter  Medications  . albuterol (PROAIR  HFA) 108 (90 Base) MCG/ACT inhaler    Sig: Inhale 1-2 puffs into the lungs every 6 (six) hours as needed for wheezing or shortness of breath.    Dispense:  18 g    Refill:  1  . Azelastine HCl 0.15 % SOLN    Sig: Place 1-2 sprays into both nostrils 2 (two) times daily as needed.    Dispense:  30 mL    Refill:  5  . Mometasone Furoate (ASMANEX HFA) 100 MCG/ACT AERO    Sig: Inhale 2 puffs into the lungs 2 (two) times daily as needed.    Dispense:  13 g    Refill:  5    Diagnostics: Spirometry:  Normal with an FEV1 of 104% predicted.  Please see scanned spirometry results for details.    Physical examination: Blood pressure (!) 108/58, pulse (!) 57, temperature 97.8 F (36.6 C), temperature source Temporal, resp. rate 18, height 5' 4.5" (1.638 m), weight 117 lb 3.2 oz (53.2 kg), SpO2 97 %.  General: Alert, interactive, in no acute distress. HEENT: TMs pearly gray, turbinates mildly edematous without discharge, post-pharynx moderately erythematous. Neck: Supple without lymphadenopathy. Lungs: Clear to auscultation without wheezing, rhonchi or rales. CV: Normal S1, S2 without murmurs. Skin: Warm and dry, without lesions or rashes.  The following portions of the patient's history were reviewed and updated as appropriate: allergies, current medications, past family history, past medical history, past social history, past surgical history and problem list.  Current Outpatient Medications  Medication Sig Dispense Refill  . albuterol (PROAIR HFA) 108 (90 Base) MCG/ACT inhaler Inhale 1-2 puffs into the lungs every 6 (six) hours as needed for wheezing or shortness of breath.  18 g 1  . Bioflavonoid Products (BIOFLEX PO) Take 2 tablets by mouth daily.    . clonazePAM (KLONOPIN) 0.5 MG tablet Take 0.5 mg by mouth 2 (two) times daily as needed for anxiety.    Marland Kitchen escitalopram (LEXAPRO) 20 MG tablet Take by mouth.    . estradiol (VIVELLE-DOT) 0.05 MG/24HR patch     . fluticasone (FLONASE) 50  MCG/ACT nasal spray Place 1 spray into both nostrils 2 (two) times daily as needed for allergies or rhinitis.    . Mometasone Furoate (ASMANEX HFA) 100 MCG/ACT AERO Inhale 2 puffs into the lungs 2 (two) times daily as needed. 13 g 5  . tretinoin (RETIN-A) 0.025 % cream Apply 1 application topically 3 (three) times a week.    . Azelastine HCl 0.15 % SOLN Place 1-2 sprays into both nostrils 2 (two) times daily as needed. 30 mL 5   No current facility-administered medications for this visit.     Allergies  Allergen Reactions  . Amoxicillin-Pot Clavulanate Nausea And Vomiting  . Codeine Anxiety and Other (See Comments)    anxiety   Review of systems: Review of systems negative except as noted in HPI / PMHx or noted below: Constitutional: Negative.  HENT: Negative.   Eyes: Negative.  Respiratory: Negative.   Cardiovascular: Negative.  Gastrointestinal: Negative.  Genitourinary: Negative.  Musculoskeletal: Negative.  Neurological: Negative.  Endo/Heme/Allergies: Negative.  Cutaneous: Negative.  Past Medical History:  Diagnosis Date  . Anxiety   . Asthma   . Sinusitis, acute     Family History  Problem Relation Age of Onset  . Allergic rhinitis Neg Hx   . Angioedema Neg Hx   . Asthma Neg Hx   . Eczema Neg Hx   . Urticaria Neg Hx   . Immunodeficiency Neg Hx     Social History   Socioeconomic History  . Marital status: Married    Spouse name: Not on file  . Number of children: Not on file  . Years of education: Not on file  . Highest education level: Not on file  Occupational History  . Not on file  Social Needs  . Financial resource strain: Not on file  . Food insecurity    Worry: Not on file    Inability: Not on file  . Transportation needs    Medical: Not on file    Non-medical: Not on file  Tobacco Use  . Smoking status: Former Smoker    Packs/day: 1.00    Years: 10.00    Pack years: 10.00    Types: Cigarettes  . Smokeless tobacco: Never Used   Substance and Sexual Activity  . Alcohol use: No  . Drug use: No  . Sexual activity: Not on file  Lifestyle  . Physical activity    Days per week: Not on file    Minutes per session: Not on file  . Stress: Not on file  Relationships  . Social Herbalist on phone: Not on file    Gets together: Not on file    Attends religious service: Not on file    Active member of club or organization: Not on file    Attends meetings of clubs or organizations: Not on file    Relationship status: Not on file  . Intimate partner violence    Fear of current or ex partner: Not on file    Emotionally abused: Not on file    Physically abused: Not on file    Forced  sexual activity: Not on file  Other Topics Concern  . Not on file  Social History Narrative  . Not on file    I appreciate the opportunity to take part in Tajuanna's care. Please do not hesitate to contact me with questions.  Sincerely,   R. Edgar Frisk, MD

## 2018-10-17 NOTE — Assessment & Plan Note (Signed)
   Continue appropriate allergen avoidance measures.  A prescription has been provided for azelastine nasal spray, 1-2 sprays per nostril 2 times daily as needed.  Nasal saline spray (i.e., Simply Saline) or nasal saline lavage (i.e., NeilMed) is recommended as needed and prior to medicated nasal sprays.  For thick post nasal drainage, add guaifenesin [phone] mg (Mucinex)  twice daily as needed with adequate hydration as discussed.

## 2018-11-10 ENCOUNTER — Other Ambulatory Visit: Payer: Self-pay

## 2018-11-10 ENCOUNTER — Encounter (HOSPITAL_COMMUNITY): Payer: Self-pay | Admitting: Emergency Medicine

## 2018-11-10 ENCOUNTER — Emergency Department (HOSPITAL_COMMUNITY)
Admission: EM | Admit: 2018-11-10 | Discharge: 2018-11-10 | Disposition: A | Discharge status: Home / Self Care | Payer: Managed Care, Other (non HMO) | Attending: Emergency Medicine | Admitting: Emergency Medicine

## 2018-11-10 ENCOUNTER — Emergency Department (HOSPITAL_COMMUNITY): Payer: Managed Care, Other (non HMO)

## 2018-11-10 DIAGNOSIS — R1032 Left lower quadrant pain: Secondary | ICD-10-CM | POA: Diagnosis present

## 2018-11-10 DIAGNOSIS — R197 Diarrhea, unspecified: Secondary | ICD-10-CM | POA: Insufficient documentation

## 2018-11-10 DIAGNOSIS — J45909 Unspecified asthma, uncomplicated: Secondary | ICD-10-CM | POA: Diagnosis not present

## 2018-11-10 DIAGNOSIS — Z79899 Other long term (current) drug therapy: Secondary | ICD-10-CM | POA: Diagnosis not present

## 2018-11-10 DIAGNOSIS — K625 Hemorrhage of anus and rectum: Secondary | ICD-10-CM | POA: Insufficient documentation

## 2018-11-10 DIAGNOSIS — K5792 Diverticulitis of intestine, part unspecified, without perforation or abscess without bleeding: Secondary | ICD-10-CM | POA: Diagnosis not present

## 2018-11-10 DIAGNOSIS — Z87891 Personal history of nicotine dependence: Secondary | ICD-10-CM | POA: Insufficient documentation

## 2018-11-10 LAB — CBC
HCT: 39.8 % (ref 36.0–46.0)
Hemoglobin: 13 g/dL (ref 12.0–15.0)
MCH: 30.7 pg (ref 26.0–34.0)
MCHC: 32.7 g/dL (ref 30.0–36.0)
MCV: 93.9 fL (ref 80.0–100.0)
Platelets: 218 10*3/uL (ref 150–400)
RBC: 4.24 MIL/uL (ref 3.87–5.11)
RDW: 12.5 % (ref 11.5–15.5)
WBC: 9.6 10*3/uL (ref 4.0–10.5)
nRBC: 0 % (ref 0.0–0.2)

## 2018-11-10 LAB — COMPREHENSIVE METABOLIC PANEL
ALT: 15 U/L (ref 0–44)
AST: 18 U/L (ref 15–41)
Albumin: 4.2 g/dL (ref 3.5–5.0)
Alkaline Phosphatase: 41 U/L (ref 38–126)
Anion gap: 7 (ref 5–15)
BUN: 15 mg/dL (ref 8–23)
CO2: 25 mmol/L (ref 22–32)
Calcium: 8.9 mg/dL (ref 8.9–10.3)
Chloride: 106 mmol/L (ref 98–111)
Creatinine, Ser: 0.54 mg/dL (ref 0.44–1.00)
GFR calc Af Amer: >60 mL/min (ref >60)
GFR calc non Af Amer: >60 mL/min (ref >60)
Glucose, Bld: 88 mg/dL (ref 70–99)
Potassium: 3.7 mmol/L (ref 3.5–5.1)
Sodium: 138 mmol/L (ref 135–145)
Total Bilirubin: 0.7 mg/dL (ref 0.3–1.2)
Total Protein: 6.6 g/dL (ref 6.5–8.1)

## 2018-11-10 LAB — TYPE AND SCREEN
ABO/RH(D): A NEG
Antibody Screen: NEGATIVE

## 2018-11-10 MED ORDER — METRONIDAZOLE 500 MG PO TABS
500.0000 mg | ORAL_TABLET | Freq: Once | ORAL | Status: AC
  Administered 2018-11-10: 500 mg via ORAL
  Filled 2018-11-10: qty 1

## 2018-11-10 MED ORDER — METRONIDAZOLE 500 MG PO TABS: 500.0000 mg | ORAL_TABLET | Freq: Two times a day (BID) | ORAL | 0 refills | Status: AC

## 2018-11-10 MED ORDER — CIPROFLOXACIN HCL 500 MG PO TABS
500.0000 mg | ORAL_TABLET | Freq: Once | ORAL | Status: AC
  Administered 2018-11-10: 500 mg via ORAL
  Filled 2018-11-10: qty 1

## 2018-11-10 MED ORDER — KETOROLAC TROMETHAMINE 15 MG/ML IJ SOLN
15.0000 mg | Freq: Once | INTRAMUSCULAR | Status: AC
  Administered 2018-11-10: 15 mg via INTRAVENOUS
  Filled 2018-11-10: qty 1

## 2018-11-10 MED ORDER — IOHEXOL 300 MG/ML  SOLN
100.0000 mL | Freq: Once | INTRAMUSCULAR | Status: AC | PRN
  Administered 2018-11-10: 100 mL via INTRAVENOUS

## 2018-11-10 MED ORDER — CIPROFLOXACIN HCL 500 MG PO TABS: 500.0000 mg | ORAL_TABLET | Freq: Two times a day (BID) | ORAL | 0 refills | Status: AC

## 2018-11-10 MED ORDER — ACETAMINOPHEN 325 MG PO TABS
650.0000 mg | ORAL_TABLET | Freq: Once | ORAL | Status: AC
  Administered 2018-11-10: 650 mg via ORAL
  Filled 2018-11-10: qty 2

## 2018-11-10 NOTE — Discharge Instructions (Signed)
Please take ciprofloxacin and metronidazole as prescribed for your diverticulitis.  Note the ciprofloxacin can cause problems with your tendons and you should refrain from strenuous exercise, particularly strenuous running.  Note you should not drink alcohol while taking the Flagyl.  If you develop fever, worsening pain, vomiting, please return to ER for reassessment.  Otherwise recommend recheck with your primary doctor early next week.

## 2018-11-10 NOTE — ED Notes (Signed)
Coming from [facility]-states blood in stool for 1 day

## 2018-11-10 NOTE — ED Notes (Signed)
Pt was verbalized discharge instructions. Pt had no further questions at this time. NAD. 

## 2018-11-10 NOTE — ED Triage Notes (Addendum)
Patient reports lower abdominal pain yesterday and one episode of blood in stool today. Denies vomiting. States positive hemoccult at [facility] today.

## 2018-11-10 NOTE — ED Provider Notes (Signed)
[facility] DEPT Provider Note   CSN: [id number] Arrival date & time: 11/10/18  1823     History   Chief Complaint Chief Complaint  Patient presents with  . Rectal Bleeding    HPI Alejandra Young is a 63 y.o. female.  Presents to the emergency department with chief complaint of left lower quadrant abdominal pain.  Patient reports has had symptoms for the past couple days.  Pain worse in left lower quadrant, crampy, gassy pain.  Intermittent.  Dull.  Nonradiating.  Has not taken any medicine for this yet.  No associated fevers.  No vomiting.  Has had some loose stools, noted streaks of blood in her stool earlier today.     HPI  Past Medical History:  Diagnosis Date  . Anxiety   . Asthma   . Sinusitis, acute     Patient Active Problem List   Diagnosis Date Noted  . Dyspnea 11/25/2016  . Other chest pain 11/25/2016  . Muscle tension dysphonia 08/11/2016  . Pre-nodular edema of the vocal folds 08/11/2016  . Vocal fold nodule 08/11/2016  . Hoarseness 06/18/2016  . Laryngopharyngeal reflux (LPR) 01/30/2016  . Coughing 02/04/2015  . Epistaxis 02/04/2015  . Mild persistent asthma 11/20/2014  . Acute non-recurrent maxillary sinusitis 11/20/2014  . Allergic rhinitis with nonallergic component 11/20/2014    Past Surgical History:  Procedure Laterality Date  . ABDOMINAL HYSTERECTOMY    . ADENOIDECTOMY    . LARYNGOSCOPY    . TONSILLECTOMY       OB History   No obstetric history on file.      Home Medications    Prior to Admission medications   Medication Sig Start Date End Date Taking? Authorizing Provider  albuterol (PROAIR HFA) 108 (90 Base) MCG/ACT inhaler Inhale 1-2 puffs into the lungs every 6 (six) hours as needed for wheezing or shortness of breath. 10/17/18   Bobbitt, Sed[place] Muta, MD  Azelastine HCl 0.15 % SOLN Place 1-2 sprays into both nostrils 2 (two) times daily as needed. 10/17/18   Bobbitt, [place] Muta, MD  Bioflavonoid  Products (BIOFLEX PO) Take 2 tablets by mouth daily.    [provider]  clonazePAM (KLONOPIN) 0.5 MG tablet Take 0.5 mg by mouth 2 (two) times daily as needed for anxiety.    [provider]  escitalopram (LEXAPRO) 20 MG tablet Take by mouth.    [provider]  estradiol (VIVELLE-DOT) 0.05 MG/24HR patch  10/22/16   [provider]  fluticasone (FLONASE) 50 MCG/ACT nasal spray Place 1 spray into both nostrils 2 (two) times daily as needed for allergies or rhinitis.    [provider]  Mometasone Furoate [facility] HFA) 100 MCG/ACT AERO Inhale 2 puffs into the lungs 2 (two) times daily as needed. 10/17/18   Bobbitt, [place] Muta, MD  tretinoin (RETIN-A) 0.025 % cream Apply 1 application topically 3 (three) times a week.    [provider]    Family History Family History  Problem Relation Age of Onset  . Allergic rhinitis Neg Hx   . Angioedema Neg Hx   . Asthma Neg Hx   . Eczema Neg Hx   . Urticaria Neg Hx   . Immunodeficiency Neg Hx     Social History Social History   Tobacco Use  . Smoking status: Former Smoker    Packs/day: 1.00    Years: 10.00    Pack years: 10.00    Types: Cigarettes  . Smokeless tobacco: Never Used  Substance Use Topics  . Alcohol use: No  . Drug use: No     Allergies   Amoxicillin-pot clavulanate and Codeine   Review of Systems Review of Systems  Constitutional: Negative for chills and fever.  HENT: Negative for ear pain and sore throat.   Eyes: Negative for pain and visual disturbance.  Respiratory: Negative for cough and shortness of breath.   Cardiovascular: Negative for chest pain and palpitations.  Gastrointestinal: Positive for abdominal pain and diarrhea. Negative for vomiting.  Genitourinary: Negative for dysuria and hematuria.  Musculoskeletal: Negative for arthralgias and back pain.  Skin: Negative for color change and rash.  Neurological: Negative for seizures and syncope.  All  other systems reviewed and are negative.    Physical Exam Updated Vital Signs BP 123/69 (BP Location: Left Arm)   Pulse (!) 55   Temp 98 F (36.7 C) (Oral)   Resp 16   SpO2 100%   Physical Exam Vitals signs and nursing note reviewed.  Constitutional:      General: She is not in acute distress.    Appearance: She is well-developed.  HENT:     Head: Normocephalic and atraumatic.  Eyes:     Conjunctiva/sclera: Conjunctivae normal.  Neck:     Musculoskeletal: Neck supple.  Cardiovascular:     Rate and Rhythm: Normal rate and regular rhythm.     Heart sounds: No murmur.  Pulmonary:     Effort: Pulmonary effort is normal. No respiratory distress.     Breath sounds: Normal breath sounds.  Abdominal:     Palpations: Abdomen is soft.     Tenderness: There is abdominal tenderness.     Comments: Tenderness to palpation in left lower quadrant, no rebound or guarding  Musculoskeletal:        General: No swelling or tenderness.  Skin:    General: Skin is warm and dry.     Capillary Refill: Capillary refill takes less than 2 seconds.  Neurological:     General: No focal deficit present.     Mental Status: She is alert and oriented to person, place, and time.  Psychiatric:        Mood and Affect: Mood normal.        Behavior: Behavior normal.      ED Treatments / Results  Labs (all labs ordered are listed, but only abnormal results are displayed) Labs Reviewed  COMPREHENSIVE METABOLIC PANEL  CBC  TYPE AND SCREEN    EKG None  Radiology No results found.  Procedures Procedures (including critical care time)  Medications Ordered in ED Medications - No data to display   Initial Impression / Assessment and Plan / ED Course  I have reviewed the triage vital signs and the nursing notes.  Pertinent labs & imaging results that were available during my care of the patient were reviewed by me and considered in my medical decision making (see chart for details).   Clinical Course as of Nov 10 2343  Thu Nov 10, 2018  2328 Recheck patient updated on results, will discharge home   [RD]    Clinical Course User Index [RD] Lucrezia Starch, MD       63 year old lady presented to the ER with crampy left lower quadrant abdominal pain and some loose stools.  She is well-appearing on exam, did note some tenderness in her left lower quadrant but otherwise soft abdomen.  Her labs were within normal limits.  CT scan concerning for early diverticulitis.  This  fits with her clinical picture.  Will initiate antibiotic therapy with Cipro and Flagyl.  She is tolerating p.o. and has stable vital signs.  Believe she is appropriate for outpatient management this time.  Will discharge home, recommend recheck with primary doctor.    After the discussed management above, the patient was determined to be safe for discharge.  The patient was in agreement with this plan and all questions regarding their care were answered.  ED return precautions were discussed and the patient will return to the ED with any significant worsening of condition.    Final Clinical Impressions(s) / ED Diagnoses   Final diagnoses:  Diverticulitis    ED Discharge Orders    None       Lucrezia Starch, MD 11/10/18 2348

## 2018-11-11 LAB — ABO/RH: ABO/RH(D): A NEG

## 2018-11-14 ENCOUNTER — Other Ambulatory Visit: Payer: Self-pay

## 2018-11-14 ENCOUNTER — Emergency Department (HOSPITAL_COMMUNITY)
Admission: EM | Admit: 2018-11-14 | Discharge: 2018-11-15 | Disposition: A | Discharge status: Home / Self Care | Payer: Managed Care, Other (non HMO) | Attending: Emergency Medicine | Admitting: Emergency Medicine

## 2018-11-14 ENCOUNTER — Encounter (HOSPITAL_COMMUNITY): Payer: Self-pay

## 2018-11-14 DIAGNOSIS — R110 Nausea: Secondary | ICD-10-CM | POA: Diagnosis not present

## 2018-11-14 DIAGNOSIS — Z5321 Procedure and treatment not carried out due to patient leaving prior to being seen by health care provider: Secondary | ICD-10-CM | POA: Insufficient documentation

## 2018-11-14 DIAGNOSIS — R1031 Right lower quadrant pain: Secondary | ICD-10-CM | POA: Diagnosis present

## 2018-11-14 MED ORDER — SODIUM CHLORIDE 0.9% FLUSH: 3.0000 mL | Freq: Once | INTRAVENOUS | Status: DC

## 2018-11-14 MED ORDER — ONDANSETRON HCL 4 MG/2ML IJ SOLN: 4.0000 mg | Freq: Once | INTRAMUSCULAR | Status: DC | PRN

## 2018-11-14 MED ORDER — ONDANSETRON 4 MG PO TBDP
4.0000 mg | ORAL_TABLET | Freq: Once | ORAL | Status: AC
  Administered 2018-11-14: 4 mg via ORAL
  Filled 2018-11-14: qty 1

## 2018-11-14 MED ORDER — IBUPROFEN 400 MG PO TABS
400.0000 mg | ORAL_TABLET | Freq: Once | ORAL | Status: AC | PRN
  Administered 2018-11-14: 400 mg via ORAL
  Filled 2018-11-14: qty 1

## 2018-11-14 NOTE — ED Triage Notes (Signed)
Pt reports sudden onset RLQ pain this evening with nausea, pt tearful in triage. Recent hx of diverticulitis but states the pain was on the left side.

## 2018-11-15 LAB — COMPREHENSIVE METABOLIC PANEL
ALT: 12 U/L (ref 0–44)
AST: 19 U/L (ref 15–41)
Albumin: 3.7 g/dL (ref 3.5–5.0)
Alkaline Phosphatase: 40 U/L (ref 38–126)
Anion gap: 9 (ref 5–15)
BUN: 10 mg/dL (ref 8–23)
CO2: 23 mmol/L (ref 22–32)
Calcium: 9.2 mg/dL (ref 8.9–10.3)
Chloride: 106 mmol/L (ref 98–111)
Creatinine, Ser: 0.62 mg/dL (ref 0.44–1.00)
GFR calc Af Amer: >60 mL/min (ref >60)
GFR calc non Af Amer: >60 mL/min (ref >60)
Glucose, Bld: 111 mg/dL — ABNORMAL HIGH (ref 70–99)
Potassium: 3.3 mmol/L — ABNORMAL LOW (ref 3.5–5.1)
Sodium: 138 mmol/L (ref 135–145)
Total Bilirubin: 0.6 mg/dL (ref 0.3–1.2)
Total Protein: 6.1 g/dL — ABNORMAL LOW (ref 6.5–8.1)

## 2018-11-15 LAB — CBC
HCT: 38.2 % (ref 36.0–46.0)
Hemoglobin: 12.5 g/dL (ref 12.0–15.0)
MCH: 30.3 pg (ref 26.0–34.0)
MCHC: 32.7 g/dL (ref 30.0–36.0)
MCV: 92.7 fL (ref 80.0–100.0)
Platelets: 216 10*3/uL (ref 150–400)
RBC: 4.12 MIL/uL (ref 3.87–5.11)
RDW: 12.3 % (ref 11.5–15.5)
WBC: 4.9 10*3/uL (ref 4.0–10.5)
nRBC: 0 % (ref 0.0–0.2)

## 2018-11-15 LAB — LIPASE, BLOOD: Lipase: 41 U/L (ref 11–51)

## 2019-03-20 ENCOUNTER — Encounter: Payer: Self-pay | Admitting: Allergy and Immunology

## 2019-03-20 ENCOUNTER — Other Ambulatory Visit: Payer: Self-pay

## 2019-03-20 ENCOUNTER — Ambulatory Visit: Payer: Managed Care, Other (non HMO) | Admitting: Allergy and Immunology

## 2019-03-20 VITALS — BP 102/60 | HR 64 | Temp 97.7°F | Resp 16 | Ht 65.0 in | Wt 115.0 lb

## 2019-03-20 DIAGNOSIS — J4520 Mild intermittent asthma, uncomplicated: Secondary | ICD-10-CM | POA: Diagnosis not present

## 2019-03-20 DIAGNOSIS — J3089 Other allergic rhinitis: Secondary | ICD-10-CM

## 2019-03-20 MED ORDER — AZELASTINE HCL 0.15 % NA SOLN: 1.0000 | Freq: Two times a day (BID) | NASAL | 5 refills | Status: DC | PRN

## 2019-03-20 MED ORDER — ASMANEX HFA 100 MCG/ACT IN AERO: 2.0000 | INHALATION_SPRAY | Freq: Two times a day (BID) | RESPIRATORY_TRACT | 5 refills | Status: DC | PRN

## 2019-03-20 NOTE — Assessment & Plan Note (Signed)
Well-controlled.  Continue albuterol HFA, 1 to 2 inhalations every 4-6 hours if needed and 15 minutes prior to vigorous exercise  During respiratory tract infections or asthma flares, add Asmanex 100g 2 inhalations 2 times per day until symptoms have returned to baseline.  A refill prescription has been provided for Asmanex.  Subjective and objective measures of pulmonary function will be followed and the treatment plan will be adjusted accordingly.

## 2019-03-20 NOTE — Patient Instructions (Addendum)
Mild intermittent asthma Well-controlled.  Continue albuterol HFA, 1 to 2 inhalations every 4-6 hours if needed and 15 minutes prior to vigorous exercise  During respiratory tract infections or asthma flares, add Asmanex 100g 2 inhalations 2 times per day until symptoms have returned to baseline.  A refill prescription has been provided for Asmanex.  Subjective and objective measures of pulmonary function will be followed and the treatment plan will be adjusted accordingly.  Allergic rhinitis with nonallergic component  Continue appropriate allergen avoidance measures.  Azelastine nasal spray, 1-2 sprays per nostril 2 times daily as needed.  Nasal saline spray (i.e., Simply Saline) or nasal saline lavage (i.e., NeilMed) is recommended as needed and prior to medicated nasal sprays.  For thick post nasal drainage, add guaifenesin [phone] mg (Mucinex)  twice daily as needed with adequate hydration as discussed.   Return in about 6 months (around 09/20/2019), or if symptoms worsen or fail to improve.

## 2019-03-20 NOTE — Assessment & Plan Note (Signed)
   Continue appropriate allergen avoidance measures.  Azelastine nasal spray, 1-2 sprays per nostril 2 times daily as needed.  Nasal saline spray (i.e., Simply Saline) or nasal saline lavage (i.e., NeilMed) is recommended as needed and prior to medicated nasal sprays.  For thick post nasal drainage, add guaifenesin [phone] mg (Mucinex)  twice daily as needed with adequate hydration as discussed.

## 2019-03-20 NOTE — Progress Notes (Signed)
Follow-up Note  RE: Alejandra Young MRN: [id number] DOB: May 02, 1955 Date of Office Visit: 03/20/2019  Primary care provider: Cari Caraway, MD Referring provider: Cari Caraway, MD  History of present illness: Alejandra Young is a 64 y.o. female with asthma and mixed rhinitis presenting today for follow-up.  She is previously seen in this clinic in October 2020.  She reports that in the interval since her previous visit her asthma has been well controlled.  She rarely requires albuterol rescue and does not experience limitations in normal daily activities or nocturnal awakenings due to lower respiratory symptoms.  She will occasionally use albuterol prior to exercise.  She has Asmanex on hand for burst therapy if needed.  She had a sinus infection requiring 2 antibiotic courses in February.  Otherwise, her nasal allergy symptoms have been well controlled with the exception of occasional throat clearing.  Assessment and plan: Mild intermittent asthma Well-controlled.  Continue albuterol HFA, 1 to 2 inhalations every 4-6 hours if needed and 15 minutes prior to vigorous exercise  During respiratory tract infections or asthma flares, add Asmanex 100g 2 inhalations 2 times per day until symptoms have returned to baseline.  A refill prescription has been provided for Asmanex.  Subjective and objective measures of pulmonary function will be followed and the treatment plan will be adjusted accordingly.  Allergic rhinitis with nonallergic component  Continue appropriate allergen avoidance measures.  Azelastine nasal spray, 1-2 sprays per nostril 2 times daily as needed.  Nasal saline spray (i.e., Simply Saline) or nasal saline lavage (i.e., NeilMed) is recommended as needed and prior to medicated nasal sprays.  For thick post nasal drainage, add guaifenesin [phone] mg (Mucinex)  twice daily as needed with adequate hydration as discussed.   Meds ordered this encounter  Medications  .  Mometasone Furoate (ASMANEX HFA) 100 MCG/ACT AERO    Sig: Inhale 2 puffs into the lungs 2 (two) times daily as needed.    Dispense:  13 g    Refill:  5  . Azelastine HCl 0.15 % SOLN    Sig: Place 1 spray into both nostrils 2 (two) times daily as needed.    Dispense:  30 mL    Refill:  5    Diagnostics: Spirometry:  Normal with an FEV1 of 102% predicted. This study was performed while the patient was asymptomatic.  Please see scanned spirometry results for details.    Physical examination: Blood pressure 102/60, pulse 64, temperature 97.7 F (36.5 C), temperature source Temporal, resp. rate 16, height 5\' 5"  (1.651 m), weight 115 lb (52.2 kg), SpO2 99 %.  General: Alert, interactive, in no acute distress. HEENT: TMs pearly gray, turbinates minimally edematous without discharge, post-pharynx mildly erythematous. Neck: Supple without lymphadenopathy. Lungs: Clear to auscultation without wheezing, rhonchi or rales. CV: Normal S1, S2 without murmurs. Skin: Warm and dry, without lesions or rashes.  The following portions of the patient's history were reviewed and updated as appropriate: allergies, current medications, past family history, past medical history, past social history, past surgical history and problem list.  Current Outpatient Medications  Medication Sig Dispense Refill  . albuterol (PROAIR HFA) 108 (90 Base) MCG/ACT inhaler Inhale 1-2 puffs into the lungs every 6 (six) hours as needed for wheezing or shortness of breath. 18 g 1  . Azelastine HCl 0.15 % SOLN Place 1 spray into both nostrils 2 (two) times daily as needed. 30 mL 5  . clonazePAM (KLONOPIN) 0.5 MG tablet Take 0.5 mg by mouth  at bedtime as needed for anxiety (sleep).     . Dapsone 5 % topical gel Apply 1 application topically every other day.    . escitalopram (LEXAPRO) 20 MG tablet Take 20 mg by mouth daily.     Marland Kitchen estradiol (VIVELLE-DOT) 0.05 MG/24HR patch Place 1 patch onto the skin 2 (two) times a week.     .  fluticasone (FLONASE) 50 MCG/ACT nasal spray Place 1 spray into both nostrils 2 (two) times daily as needed for allergies or rhinitis.    . Mometasone Furoate (ASMANEX HFA) 100 MCG/ACT AERO Inhale 2 puffs into the lungs 2 (two) times daily as needed. 13 g 5  . tretinoin (RETIN-A) 0.025 % cream Apply 1 application topically 3 (three) times a week.    Marland Kitchen glucosamine-chondroitin 500-400 MG tablet Take 1 tablet by mouth 2 (two) times daily.     No current facility-administered medications for this visit.    Allergies  Allergen Reactions  . Amoxicillin-Pot Clavulanate Nausea And Vomiting  . Codeine Anxiety and Other (See Comments)    anxiety    I appreciate the opportunity to take part in Alejandra Young's care. Please do not hesitate to contact me with questions.  Sincerely,   R. Edgar Frisk, MD

## 2019-09-29 DIAGNOSIS — E2839 Other primary ovarian failure: Secondary | ICD-10-CM | POA: Diagnosis not present

## 2019-09-29 DIAGNOSIS — Z23 Encounter for immunization: Secondary | ICD-10-CM | POA: Diagnosis not present

## 2019-09-29 DIAGNOSIS — N952 Postmenopausal atrophic vaginitis: Secondary | ICD-10-CM | POA: Diagnosis not present

## 2019-09-29 DIAGNOSIS — R69 Illness, unspecified: Secondary | ICD-10-CM | POA: Diagnosis not present

## 2019-09-29 DIAGNOSIS — B009 Herpesviral infection, unspecified: Secondary | ICD-10-CM | POA: Diagnosis not present

## 2019-09-29 DIAGNOSIS — M25511 Pain in right shoulder: Secondary | ICD-10-CM | POA: Diagnosis not present

## 2019-09-29 DIAGNOSIS — N951 Menopausal and female climacteric states: Secondary | ICD-10-CM | POA: Diagnosis not present

## 2019-09-29 DIAGNOSIS — G4700 Insomnia, unspecified: Secondary | ICD-10-CM | POA: Diagnosis not present

## 2019-09-29 DIAGNOSIS — J45991 Cough variant asthma: Secondary | ICD-10-CM | POA: Diagnosis not present

## 2019-10-10 DIAGNOSIS — M25511 Pain in right shoulder: Secondary | ICD-10-CM | POA: Diagnosis not present

## 2019-10-12 DIAGNOSIS — Z780 Asymptomatic menopausal state: Secondary | ICD-10-CM | POA: Diagnosis not present

## 2019-10-12 DIAGNOSIS — M8589 Other specified disorders of bone density and structure, multiple sites: Secondary | ICD-10-CM | POA: Diagnosis not present

## 2019-10-12 DIAGNOSIS — Z1231 Encounter for screening mammogram for malignant neoplasm of breast: Secondary | ICD-10-CM | POA: Diagnosis not present

## 2019-10-13 DIAGNOSIS — M25511 Pain in right shoulder: Secondary | ICD-10-CM | POA: Diagnosis not present

## 2019-10-17 DIAGNOSIS — M25511 Pain in right shoulder: Secondary | ICD-10-CM | POA: Diagnosis not present

## 2019-11-15 DIAGNOSIS — R69 Illness, unspecified: Secondary | ICD-10-CM | POA: Diagnosis not present

## 2019-12-01 DIAGNOSIS — J329 Chronic sinusitis, unspecified: Secondary | ICD-10-CM | POA: Diagnosis not present

## 2019-12-01 DIAGNOSIS — H1033 Unspecified acute conjunctivitis, bilateral: Secondary | ICD-10-CM | POA: Diagnosis not present

## 2020-01-15 DIAGNOSIS — Z131 Encounter for screening for diabetes mellitus: Secondary | ICD-10-CM | POA: Diagnosis not present

## 2020-01-19 DIAGNOSIS — G4700 Insomnia, unspecified: Secondary | ICD-10-CM | POA: Diagnosis not present

## 2020-01-19 DIAGNOSIS — F411 Generalized anxiety disorder: Secondary | ICD-10-CM | POA: Diagnosis not present

## 2020-01-19 DIAGNOSIS — R69 Illness, unspecified: Secondary | ICD-10-CM | POA: Diagnosis not present

## 2020-01-19 DIAGNOSIS — Z124 Encounter for screening for malignant neoplasm of cervix: Secondary | ICD-10-CM | POA: Diagnosis not present

## 2020-01-19 DIAGNOSIS — N951 Menopausal and female climacteric states: Secondary | ICD-10-CM | POA: Diagnosis not present

## 2020-01-19 DIAGNOSIS — Z86010 Personal history of colonic polyps: Secondary | ICD-10-CM | POA: Diagnosis not present

## 2020-01-19 DIAGNOSIS — Z23 Encounter for immunization: Secondary | ICD-10-CM | POA: Diagnosis not present

## 2020-01-19 DIAGNOSIS — N952 Postmenopausal atrophic vaginitis: Secondary | ICD-10-CM | POA: Diagnosis not present

## 2020-01-19 DIAGNOSIS — J45991 Cough variant asthma: Secondary | ICD-10-CM | POA: Diagnosis not present

## 2020-01-19 DIAGNOSIS — B009 Herpesviral infection, unspecified: Secondary | ICD-10-CM | POA: Diagnosis not present

## 2020-01-19 DIAGNOSIS — Z0000 Encounter for general adult medical examination without abnormal findings: Secondary | ICD-10-CM | POA: Diagnosis not present

## 2020-01-19 DIAGNOSIS — M25511 Pain in right shoulder: Secondary | ICD-10-CM | POA: Diagnosis not present

## 2020-03-14 DIAGNOSIS — L738 Other specified follicular disorders: Secondary | ICD-10-CM | POA: Diagnosis not present

## 2020-03-29 DIAGNOSIS — K649 Unspecified hemorrhoids: Secondary | ICD-10-CM | POA: Diagnosis not present

## 2020-03-29 DIAGNOSIS — R1031 Right lower quadrant pain: Secondary | ICD-10-CM | POA: Diagnosis not present

## 2020-04-22 DIAGNOSIS — N951 Menopausal and female climacteric states: Secondary | ICD-10-CM | POA: Diagnosis not present

## 2020-04-22 DIAGNOSIS — Z124 Encounter for screening for malignant neoplasm of cervix: Secondary | ICD-10-CM | POA: Diagnosis not present

## 2020-04-22 DIAGNOSIS — N952 Postmenopausal atrophic vaginitis: Secondary | ICD-10-CM | POA: Diagnosis not present

## 2020-04-22 DIAGNOSIS — Z7689 Persons encountering health services in other specified circumstances: Secondary | ICD-10-CM | POA: Diagnosis not present

## 2020-05-05 DIAGNOSIS — L239 Allergic contact dermatitis, unspecified cause: Secondary | ICD-10-CM | POA: Diagnosis not present

## 2020-06-07 DIAGNOSIS — Z9109 Other allergy status, other than to drugs and biological substances: Secondary | ICD-10-CM | POA: Diagnosis not present

## 2020-06-07 DIAGNOSIS — R053 Chronic cough: Secondary | ICD-10-CM | POA: Diagnosis not present

## 2020-07-19 DIAGNOSIS — N8111 Cystocele, midline: Secondary | ICD-10-CM | POA: Diagnosis not present

## 2020-08-02 DIAGNOSIS — J40 Bronchitis, not specified as acute or chronic: Secondary | ICD-10-CM | POA: Diagnosis not present

## 2020-08-02 DIAGNOSIS — R059 Cough, unspecified: Secondary | ICD-10-CM | POA: Diagnosis not present

## 2020-08-02 DIAGNOSIS — Z03818 Encounter for observation for suspected exposure to other biological agents ruled out: Secondary | ICD-10-CM | POA: Diagnosis not present

## 2020-08-02 DIAGNOSIS — J019 Acute sinusitis, unspecified: Secondary | ICD-10-CM | POA: Diagnosis not present

## 2020-08-26 DIAGNOSIS — H40013 Open angle with borderline findings, low risk, bilateral: Secondary | ICD-10-CM | POA: Diagnosis not present

## 2020-08-26 DIAGNOSIS — H04123 Dry eye syndrome of bilateral lacrimal glands: Secondary | ICD-10-CM | POA: Diagnosis not present

## 2020-08-26 DIAGNOSIS — H10413 Chronic giant papillary conjunctivitis, bilateral: Secondary | ICD-10-CM | POA: Diagnosis not present

## 2020-09-11 DIAGNOSIS — L821 Other seborrheic keratosis: Secondary | ICD-10-CM | POA: Diagnosis not present

## 2020-09-11 DIAGNOSIS — L578 Other skin changes due to chronic exposure to nonionizing radiation: Secondary | ICD-10-CM | POA: Diagnosis not present

## 2020-09-11 DIAGNOSIS — L570 Actinic keratosis: Secondary | ICD-10-CM | POA: Diagnosis not present

## 2020-09-11 DIAGNOSIS — L603 Nail dystrophy: Secondary | ICD-10-CM | POA: Diagnosis not present

## 2020-09-11 DIAGNOSIS — L700 Acne vulgaris: Secondary | ICD-10-CM | POA: Diagnosis not present

## 2020-09-28 IMAGING — CT CT ABD-PELV W/ CM
2 of 5 series · 16 of 46 positions shown, 18 images · IV contrast (omnipaque)
Comparison: None.

CLINICAL DATA: Left lower quadrant pain

EXAM:
CT ABDOMEN AND PELVIS WITH CONTRAST
TECHNIQUE: Multidetector CT imaging of the abdomen and pelvis was performed
using the standard protocol following bolus administration of
intravenous contrast.
CONTRAST:  <See Chart> OMNIPAQUE IOHEXOL 300 MG/ML  SOLN

[Series 2: axial st · axial · 0.70mm/px · z∈[+944,+1279]mm · 13 of 79 slices shown, 15 images]
[im 6/79  soft-tissue]
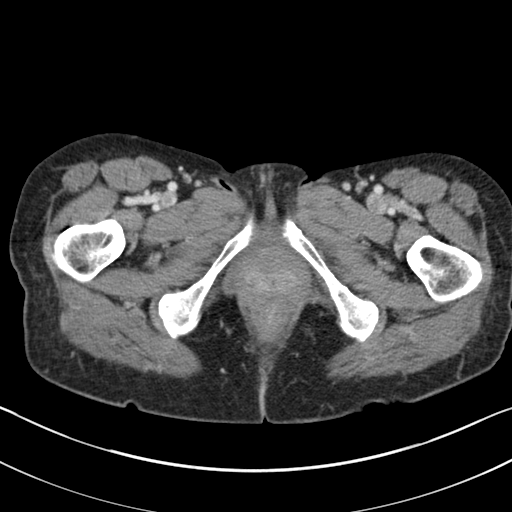
[im 6/79  bone]
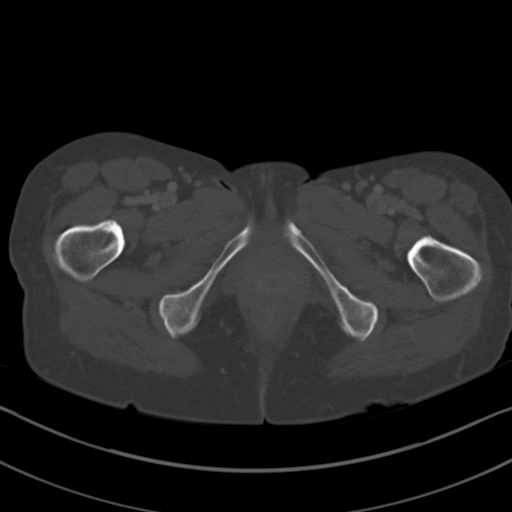
[im 11/79  soft-tissue]
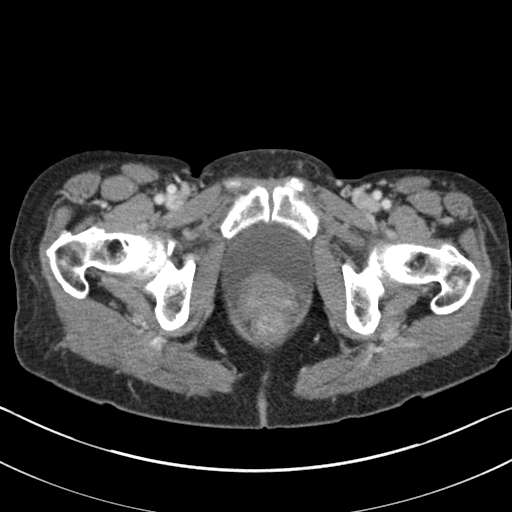
[im 16/79  soft-tissue]
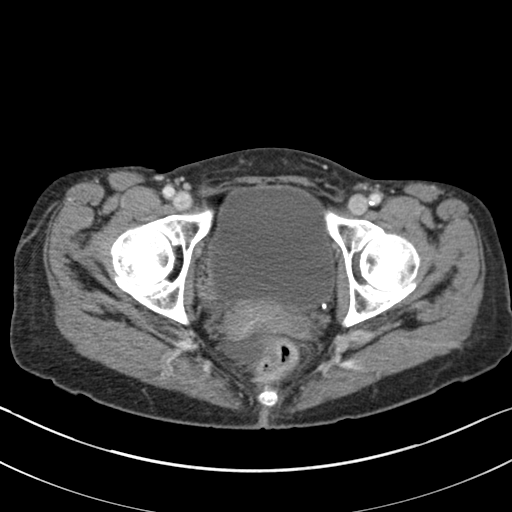
[im 21/79  soft-tissue]
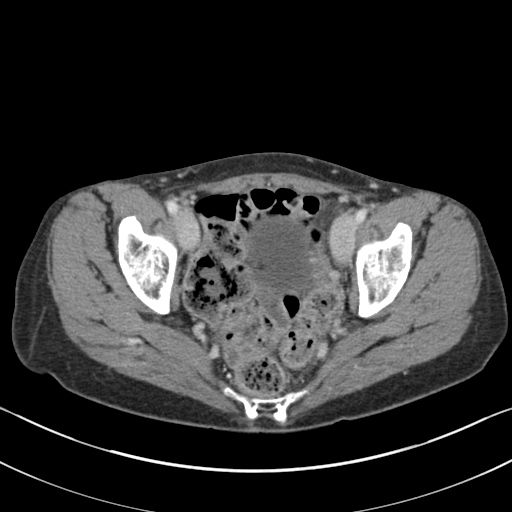
[im 27/79  soft-tissue]
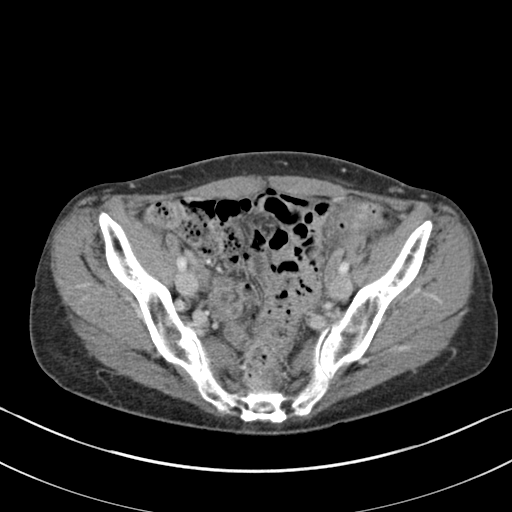
[im 32/79  soft-tissue]
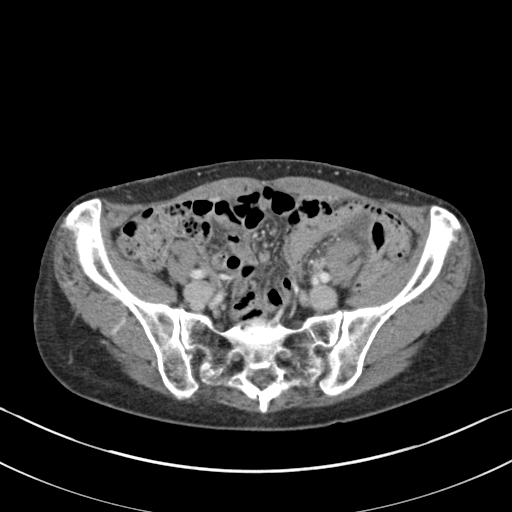
[im 42/79  soft-tissue]
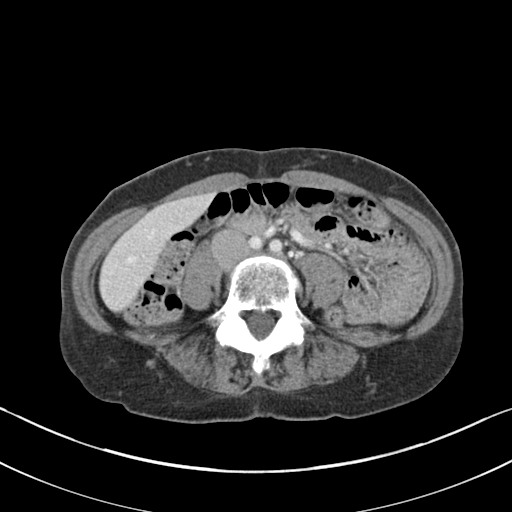
[im 47/79  soft-tissue]
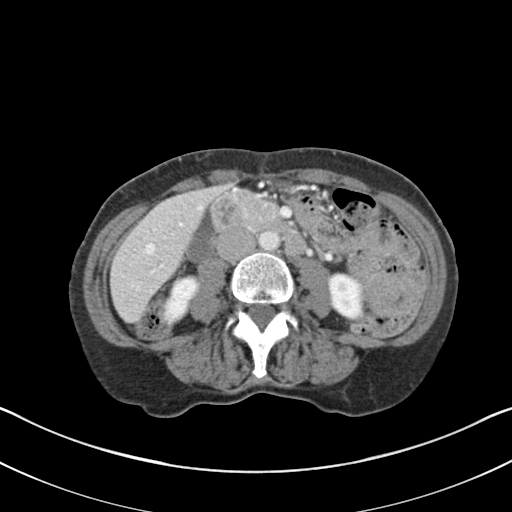
[im 53/79  soft-tissue]
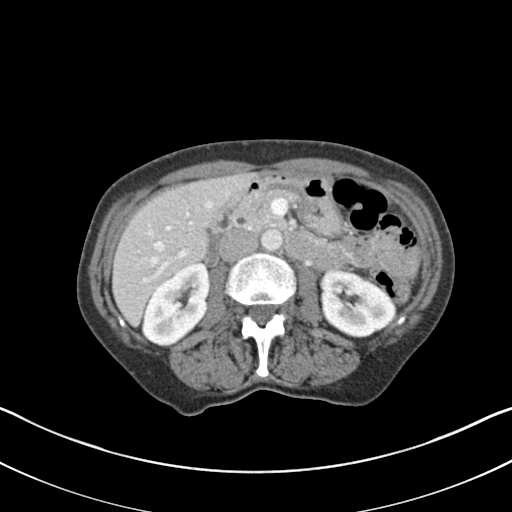
[im 53/79  bone]
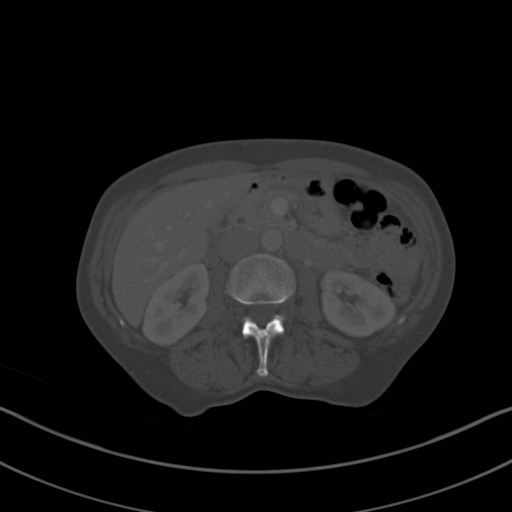
[im 58/79  soft-tissue]
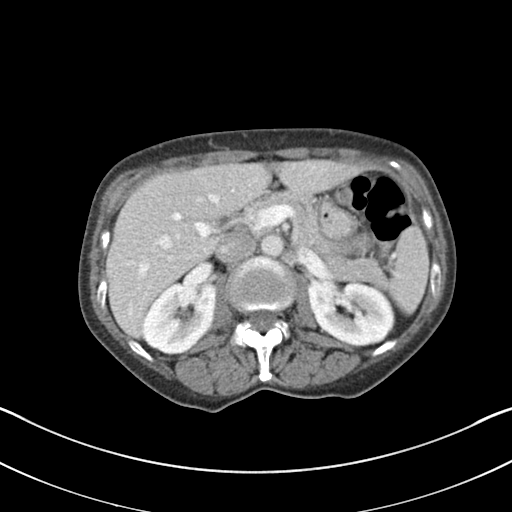
[im 63/79  soft-tissue]
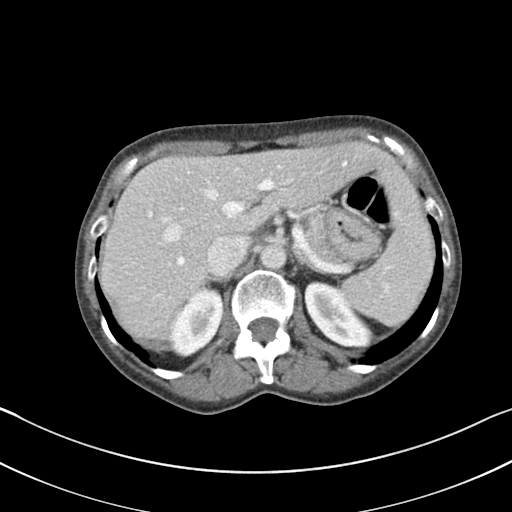
[im 68/79  soft-tissue]
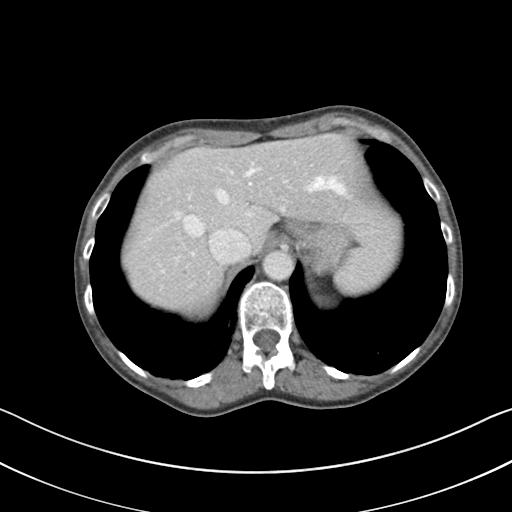
[im 73/79  soft-tissue]
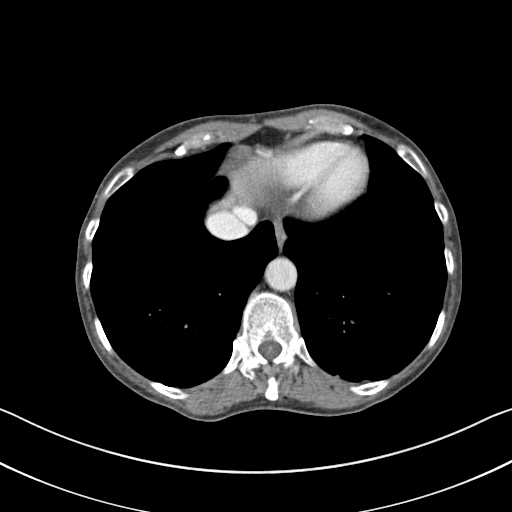

[Series 5: coronal st · coronal · 0.64mm/px · 3 of 116 slices shown]
[im 39/116  soft-tissue]
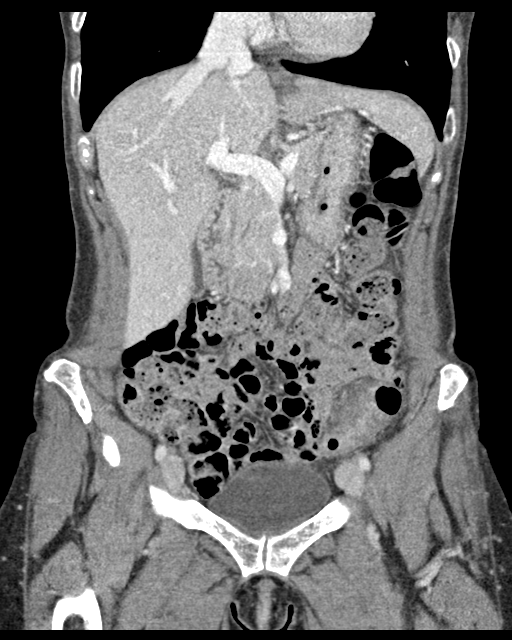
[im 52/116  soft-tissue]
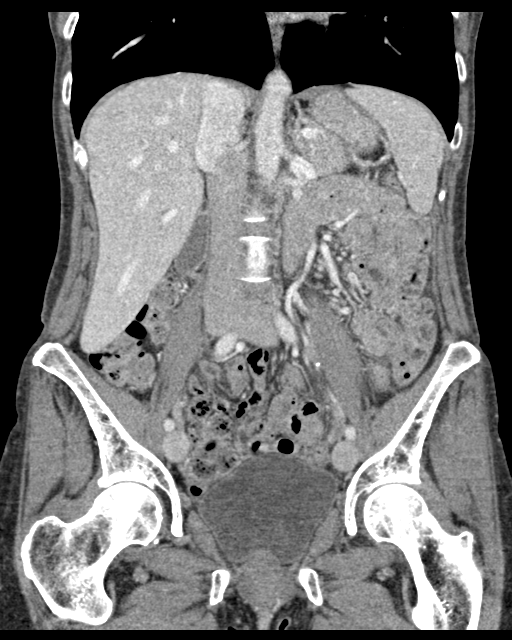
[im 64/116  soft-tissue]
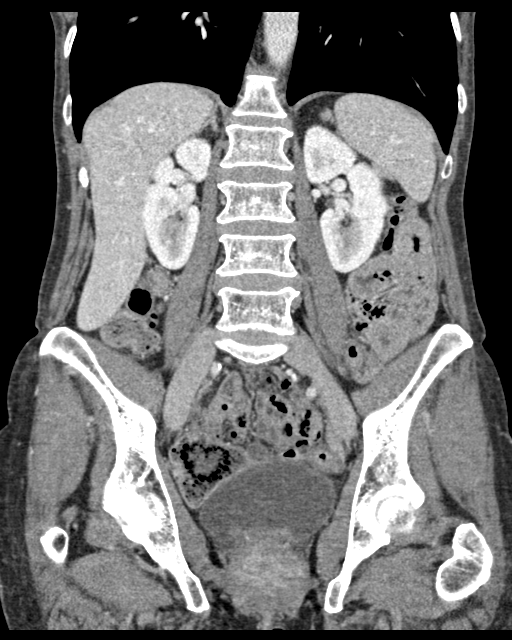

[16 of 46 positions shown; findings below may reference images not displayed]

FINDINGS: Lower chest: Lung bases are clear. No effusions. Heart is normal
size.

Hepatobiliary: No focal hepatic abnormality. Gallbladder
unremarkable.

Pancreas: No focal abnormality or ductal dilatation.

Spleen: No focal abnormality.  Normal size.

Adrenals/Urinary Tract: No adrenal abnormality. No focal renal
abnormality. No stones or hydronephrosis. Urinary bladder is
unremarkable.

Stomach/Bowel: Sigmoid diverticulosis. Inflammatory stranding around
the proximal sigmoid colon compatible with active diverticulitis.
Moderate stool throughout the colon. No evidence of bowel
obstruction.

Vascular/Lymphatic: No evidence of aneurysm or adenopathy.

Reproductive: No mass.

Other: Small amount of free fluid in the pelvis.  No free air.

Musculoskeletal: No acute bony abnormality.
IMPRESSION: Sigmoid diverticulosis. Inflammation/stranding around the proximal
sigmoid colon compatible with early active diverticulitis. No
complicating feature.

Moderate stool burden throughout the colon.

## 2020-10-01 DIAGNOSIS — R059 Cough, unspecified: Secondary | ICD-10-CM | POA: Diagnosis not present

## 2020-10-01 DIAGNOSIS — J029 Acute pharyngitis, unspecified: Secondary | ICD-10-CM | POA: Diagnosis not present

## 2020-10-01 DIAGNOSIS — Z03818 Encounter for observation for suspected exposure to other biological agents ruled out: Secondary | ICD-10-CM | POA: Diagnosis not present

## 2020-10-01 DIAGNOSIS — J069 Acute upper respiratory infection, unspecified: Secondary | ICD-10-CM | POA: Diagnosis not present

## 2020-10-23 DIAGNOSIS — R0982 Postnasal drip: Secondary | ICD-10-CM | POA: Diagnosis not present

## 2020-10-23 DIAGNOSIS — R490 Dysphonia: Secondary | ICD-10-CM | POA: Diagnosis not present

## 2020-10-23 DIAGNOSIS — R053 Chronic cough: Secondary | ICD-10-CM | POA: Diagnosis not present

## 2020-10-23 DIAGNOSIS — J383 Other diseases of vocal cords: Secondary | ICD-10-CM | POA: Diagnosis not present

## 2020-10-23 DIAGNOSIS — R0981 Nasal congestion: Secondary | ICD-10-CM | POA: Diagnosis not present

## 2020-11-06 DIAGNOSIS — J320 Chronic maxillary sinusitis: Secondary | ICD-10-CM | POA: Diagnosis not present

## 2020-11-06 DIAGNOSIS — K219 Gastro-esophageal reflux disease without esophagitis: Secondary | ICD-10-CM | POA: Diagnosis not present

## 2020-11-06 DIAGNOSIS — J4531 Mild persistent asthma with (acute) exacerbation: Secondary | ICD-10-CM | POA: Diagnosis not present

## 2020-11-06 DIAGNOSIS — Z87891 Personal history of nicotine dependence: Secondary | ICD-10-CM | POA: Diagnosis not present

## 2020-11-11 DIAGNOSIS — R058 Other specified cough: Secondary | ICD-10-CM | POA: Diagnosis not present

## 2020-11-11 DIAGNOSIS — R0982 Postnasal drip: Secondary | ICD-10-CM | POA: Diagnosis not present

## 2020-11-11 DIAGNOSIS — J329 Chronic sinusitis, unspecified: Secondary | ICD-10-CM | POA: Diagnosis not present

## 2020-11-11 DIAGNOSIS — J342 Deviated nasal septum: Secondary | ICD-10-CM | POA: Diagnosis not present

## 2020-11-11 DIAGNOSIS — R448 Other symptoms and signs involving general sensations and perceptions: Secondary | ICD-10-CM | POA: Diagnosis not present

## 2020-11-11 DIAGNOSIS — J3489 Other specified disorders of nose and nasal sinuses: Secondary | ICD-10-CM | POA: Diagnosis not present

## 2020-11-11 DIAGNOSIS — R519 Headache, unspecified: Secondary | ICD-10-CM | POA: Diagnosis not present

## 2020-11-15 DIAGNOSIS — R053 Chronic cough: Secondary | ICD-10-CM | POA: Diagnosis not present

## 2020-12-06 DIAGNOSIS — Z1231 Encounter for screening mammogram for malignant neoplasm of breast: Secondary | ICD-10-CM | POA: Diagnosis not present

## 2020-12-16 DIAGNOSIS — J4540 Moderate persistent asthma, uncomplicated: Secondary | ICD-10-CM | POA: Diagnosis not present

## 2020-12-16 DIAGNOSIS — J0100 Acute maxillary sinusitis, unspecified: Secondary | ICD-10-CM | POA: Diagnosis not present

## 2020-12-16 DIAGNOSIS — Z87891 Personal history of nicotine dependence: Secondary | ICD-10-CM | POA: Diagnosis not present

## 2020-12-16 DIAGNOSIS — J3089 Other allergic rhinitis: Secondary | ICD-10-CM | POA: Diagnosis not present

## 2021-01-22 DIAGNOSIS — Z0000 Encounter for general adult medical examination without abnormal findings: Secondary | ICD-10-CM | POA: Diagnosis not present

## 2021-01-31 DIAGNOSIS — Z9109 Other allergy status, other than to drugs and biological substances: Secondary | ICD-10-CM | POA: Diagnosis not present

## 2021-01-31 DIAGNOSIS — J069 Acute upper respiratory infection, unspecified: Secondary | ICD-10-CM | POA: Diagnosis not present

## 2021-01-31 DIAGNOSIS — Z131 Encounter for screening for diabetes mellitus: Secondary | ICD-10-CM | POA: Diagnosis not present

## 2021-01-31 DIAGNOSIS — H9193 Unspecified hearing loss, bilateral: Secondary | ICD-10-CM | POA: Diagnosis not present

## 2021-01-31 DIAGNOSIS — R69 Illness, unspecified: Secondary | ICD-10-CM | POA: Diagnosis not present

## 2021-01-31 DIAGNOSIS — F411 Generalized anxiety disorder: Secondary | ICD-10-CM | POA: Diagnosis not present

## 2021-01-31 DIAGNOSIS — N951 Menopausal and female climacteric states: Secondary | ICD-10-CM | POA: Diagnosis not present

## 2021-01-31 DIAGNOSIS — L309 Dermatitis, unspecified: Secondary | ICD-10-CM | POA: Diagnosis not present

## 2021-02-10 DIAGNOSIS — R053 Chronic cough: Secondary | ICD-10-CM | POA: Diagnosis not present

## 2021-02-10 DIAGNOSIS — J310 Chronic rhinitis: Secondary | ICD-10-CM | POA: Diagnosis not present

## 2021-02-10 DIAGNOSIS — J383 Other diseases of vocal cords: Secondary | ICD-10-CM | POA: Diagnosis not present

## 2021-02-10 DIAGNOSIS — R490 Dysphonia: Secondary | ICD-10-CM | POA: Diagnosis not present

## 2021-04-15 DIAGNOSIS — J3089 Other allergic rhinitis: Secondary | ICD-10-CM | POA: Diagnosis not present

## 2021-04-15 DIAGNOSIS — R053 Chronic cough: Secondary | ICD-10-CM | POA: Diagnosis not present

## 2021-04-15 DIAGNOSIS — J4540 Moderate persistent asthma, uncomplicated: Secondary | ICD-10-CM | POA: Diagnosis not present

## 2021-05-23 DIAGNOSIS — L578 Other skin changes due to chronic exposure to nonionizing radiation: Secondary | ICD-10-CM | POA: Diagnosis not present

## 2021-05-23 DIAGNOSIS — L219 Seborrheic dermatitis, unspecified: Secondary | ICD-10-CM | POA: Diagnosis not present

## 2021-05-23 DIAGNOSIS — L739 Follicular disorder, unspecified: Secondary | ICD-10-CM | POA: Diagnosis not present

## 2021-05-23 DIAGNOSIS — L858 Other specified epidermal thickening: Secondary | ICD-10-CM | POA: Diagnosis not present

## 2021-05-23 DIAGNOSIS — L658 Other specified nonscarring hair loss: Secondary | ICD-10-CM | POA: Diagnosis not present

## 2021-06-05 DIAGNOSIS — D485 Neoplasm of uncertain behavior of skin: Secondary | ICD-10-CM | POA: Diagnosis not present

## 2021-06-05 DIAGNOSIS — L570 Actinic keratosis: Secondary | ICD-10-CM | POA: Diagnosis not present

## 2021-06-05 DIAGNOSIS — C44622 Squamous cell carcinoma of skin of right upper limb, including shoulder: Secondary | ICD-10-CM | POA: Diagnosis not present

## 2021-07-03 DIAGNOSIS — L905 Scar conditions and fibrosis of skin: Secondary | ICD-10-CM | POA: Diagnosis not present

## 2021-07-03 DIAGNOSIS — C44622 Squamous cell carcinoma of skin of right upper limb, including shoulder: Secondary | ICD-10-CM | POA: Diagnosis not present

## 2021-07-16 DIAGNOSIS — L658 Other specified nonscarring hair loss: Secondary | ICD-10-CM | POA: Diagnosis not present

## 2021-07-16 DIAGNOSIS — L219 Seborrheic dermatitis, unspecified: Secondary | ICD-10-CM | POA: Diagnosis not present

## 2021-07-16 DIAGNOSIS — L739 Follicular disorder, unspecified: Secondary | ICD-10-CM | POA: Diagnosis not present

## 2021-07-16 DIAGNOSIS — T8149XA Infection following a procedure, other surgical site, initial encounter: Secondary | ICD-10-CM | POA: Diagnosis not present

## 2021-07-16 DIAGNOSIS — L299 Pruritus, unspecified: Secondary | ICD-10-CM | POA: Diagnosis not present

## 2021-07-20 ENCOUNTER — Ambulatory Visit
Admission: EM | Admit: 2021-07-20 | Discharge: 2021-07-20 | Disposition: A | Discharge status: Home / Self Care | Payer: Medicare HMO

## 2021-07-20 DIAGNOSIS — B349 Viral infection, unspecified: Secondary | ICD-10-CM

## 2021-07-20 NOTE — ED Provider Notes (Signed)
[facility]    CSN: [id number] Arrival date & time: 07/20/21  1553    HISTORY   Chief Complaint  Patient presents with   Headache   Fever   Sore Throat   Cough   HPI Alejandra Young is a pleasant, 66 y.o. female who presents to urgent care today. Patient presents to urgent care complaining of a 4-day history of fever, sore throat, nausea, vomiting, headache and sinus pressure.  Patient states has been taking Robitussin, Tylenol and ibuprofen.  Patient states that her husband began to have similar symptoms 3 days ago.  Patient states she has home COVID-19 test but has not taken one.  The history is provided by the patient.   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    Home Medications    Prior to Admission medications   Medication Sig Start Date End Date Taking? Authorizing Provider  albuterol (PROAIR HFA) 108 (90 Base) MCG/ACT inhaler Inhale 1-2 puffs into the lungs every 6 (six) hours as needed for wheezing or shortness of breath. 10/17/18   Bobbitt, [place]alia Muta, MD  clonazePAM (KLONOPIN) 0.5 MG tablet Take 0.5 mg by mouth at bedtime as needed for anxiety (sleep).     [provider]  escitalopram (LEXAPRO) 20 MG tablet Take 20 mg by mouth daily.      [provider]  estradiol (VIVELLE-DOT) 0.05 MG/24HR patch Place 1 patch onto the skin 2 (two) times a week.  10/22/16   [provider]  glucosamine-chondroitin 500-400 MG tablet Take 1 tablet by mouth 2 (two) times daily.    [provider]  tretinoin (RETIN-A) 0.025 % cream Apply 1 application topically 3 (three) times a week.    [provider]    Family History Family History  Problem Relation Age of Onset   Allergic rhinitis Neg Hx    Angioedema Neg Hx    Asthma Neg Hx    Eczema Neg Hx    Urticaria Neg Hx    Immunodeficiency Neg Hx    Social History Social History   Tobacco Use   Smoking status: Former    Packs/day: 1.00    Years: 10.00    Total pack years: 10.00    Types: Cigarettes   Smokeless tobacco: Never  Vaping Use   Vaping Use: Never used  Substance Use Topics   Alcohol use: No   Drug use: No   Allergies   Amoxicillin-pot clavulanate and Codeine  Review of Systems Review of Systems Pertinent findings revealed after performing a 14 point review of systems has been noted in the history of present illness.  Physical Exam Triage Vital Signs ED Triage Vitals  Enc Vitals Group  BP 11/01/20 0827 (!) 147/82     Pulse Rate 11/01/20 0827 72     Resp 11/01/20 0827 18     Temp 11/01/20 0827 98.3 F (36.8 C)     Temp Source 11/01/20 0827 Oral     SpO2 11/01/20 0827 98 %     Weight --      Height --      Head Circumference --      Peak Flow --      Pain Score 11/01/20 0826 5     Pain Loc --      Pain Edu? --      Excl. in [place]? --   No data found.  Updated Vital Signs BP 94/61 (BP Location: Left Arm)   Pulse 70   Temp 99.3 F (37.4 C) (Oral)   Resp 16   SpO2 95%   Physical Exam Vitals and nursing note reviewed.  Constitutional:      General: She is not in acute distress.    Appearance: Normal appearance. She is not ill-appearing.  HENT:     Head: Normocephalic and atraumatic.     Salivary Glands: Right  salivary gland is not diffusely enlarged or tender. Left salivary gland is not diffusely enlarged or tender.     Right Ear: Tympanic membrane, ear canal and external ear normal. No drainage. No middle ear effusion. There is no impacted cerumen. Tympanic membrane is not erythematous or bulging.     Left Ear: Tympanic membrane, ear canal and external ear normal. No drainage.  No middle ear effusion. There is no impacted cerumen. Tympanic membrane is not erythematous or bulging.     Nose: Nose normal. No nasal deformity, septal deviation, mucosal edema, congestion or rhinorrhea.     Right Turbinates: Not enlarged, swollen or pale.     Left Turbinates: Not enlarged, swollen or pale.     Right Sinus: No maxillary sinus tenderness or frontal sinus tenderness.     Left Sinus: No maxillary sinus tenderness or frontal sinus tenderness.     Mouth/Throat:     Lips: Pink. No lesions.     Mouth: Mucous membranes are moist. No oral lesions.     Pharynx: Oropharynx is clear. Uvula midline. No posterior oropharyngeal erythema or uvula swelling.     Tonsils: No tonsillar exudate. 0 on the right. 0 on the left.  Eyes:     General: Lids are normal.        Right eye: No discharge.        Left eye: No discharge.     Extraocular Movements: Extraocular movements intact.     Conjunctiva/sclera: Conjunctivae normal.     Right eye: Right conjunctiva is not injected.     Left eye: Left conjunctiva is not injected.  Neck:     Trachea: Trachea and phonation normal.  Cardiovascular:     Rate and Rhythm: Normal rate and regular rhythm.     Pulses: Normal pulses.     Heart sounds: Normal heart sounds. No murmur heard.    No friction rub. No gallop.  Pulmonary:     Effort: Pulmonary effort is normal. No accessory muscle usage, prolonged expiration or respiratory distress.     Breath sounds: Normal breath sounds. No stridor, decreased air movement or transmitted upper airway sounds. No decreased breath sounds, wheezing,  rhonchi or rales.  Chest:     Chest wall: No tenderness.  Musculoskeletal:        General: Normal range of  motion.     Cervical back: Normal range of motion and neck supple. Normal range of motion.  Lymphadenopathy:     Cervical: No cervical adenopathy.  Skin:    General: Skin is warm and dry.     Findings: No erythema or rash.  Neurological:     General: No focal deficit present.     Mental Status: She is alert and oriented to person, place, and time.  Psychiatric:        Mood and Affect: Mood normal.        Behavior: Behavior normal.     Visual Acuity Right Eye Distance:   Left Eye Distance:   Bilateral Distance:    Right Eye Near:   Left Eye Near:    Bilateral Near:     UC Couse / Diagnostics / Procedures:     Radiology No results found.  Procedures Procedures (including critical care time) EKG  Pending results:  Labs Reviewed - No data to display  Medications Ordered in UC: Medications - No data to display  UC Diagnoses / Final Clinical Impressions(s)   I have reviewed the triage vital signs and the nursing notes.  Pertinent labs & imaging results that were available during my care of the patient were reviewed by me and considered in my medical decision making (see chart for details).    Final diagnoses:  Viral illness   Patient advised I recommend COVID testing.  Patient states she would like to take her home COVID test because it is faster than the one we offer here.  Patient advised that she can call back to the clinic to let us know if the test is positive and we will be happy to prescribe Paxlovid for her.  Return precautions advised.  Conservative care recommended.  ED Prescriptions   None    PDMP not reviewed this encounter.  Disposition Upon Discharge:  Condition: stable for discharge home Home: take medications as prescribed; routine discharge instructions as discussed; follow up as advised.  Patient presented with an acute illness with  associated systemic symptoms and significant discomfort requiring urgent management. In my opinion, this is a condition that a prudent lay person (someone who possesses an average knowledge of health and medicine) may potentially expect to result in complications if not addressed urgently such as respiratory distress, impairment of bodily function or dysfunction of bodily organs.   Routine symptom specific, illness specific and/or disease specific instructions were discussed with the patient and/or caregiver at length.   As such, the patient has been evaluated and assessed, work-up was performed and treatment was provided in alignment with urgent care protocols and evidence based medicine.  Patient/parent/caregiver has been advised that the patient may require follow up for further testing and treatment if the symptoms continue in spite of treatment, as clinically indicated and appropriate.  If the patient was tested for COVID-19, Influenza and/or RSV, then the patient/parent/guardian was advised to isolate at home pending the results of his/her diagnostic coronavirus test and potentially longer if they're positive. I have also advised pt that if his/her COVID-19 test returns positive, it's recommended to self-isolate for at least 10 days after symptoms first appeared AND until fever-free for 24 hours without fever reducer AND other symptoms have improved or resolved. Discussed self-isolation recommendations as well as instructions for household member/close contacts as per the [facility] and [place] DHHS, and also gave patient the [place] packet with this information.  Patient/parent/caregiver has been advised to return to the [facility]  or PCP in 3-5 days if no better; to PCP or the Emergency Department if new signs and symptoms develop, or if the current signs or symptoms continue to change or worsen for further workup, evaluation and treatment as clinically indicated and appropriate  The patient will follow up with their  current PCP if and as advised. If the patient does not currently have a PCP we will assist them in obtaining one.   The patient may need specialty follow up if the symptoms continue, in spite of conservative treatment and management, for further workup, evaluation, consultation and treatment as clinically indicated and appropriate.  Patient/parent/caregiver verbalized understanding and agreement of plan as discussed.  All questions were addressed during visit.  Please see discharge instructions below for further details of plan.  Discharge Instructions:   Discharge Instructions      Based on physical exam findings, I believe that you are suffering from a viral illness at this time.  I recommend COVID testing.  Because of the home COVID tests are much faster way to get COVID testing done, I agree with your decision to purchase a home COVID test this evening.  Please not hesitate to give me a call tomorrow morning if your test is positive and I will be happy to provide you with a prescription for Paxlovid.  Thank you for visiting urgent care today.  I hope you feel better soon.    This office note has been dictated using Museum/gallery curator.  Unfortunately, this method of dictation can sometimes lead to typographical or grammatical errors.  I apologize for your inconvenience in advance if this occurs.  Please do not hesitate to reach out to me if clarification is needed.      Lynden Oxford Scales, [place] 07/21/21 [phone]

## 2021-07-20 NOTE — Discharge Instructions (Signed)
Based on physical exam findings, I believe that you are suffering from a viral illness at this time.  I recommend COVID testing.  Because of the home COVID tests are much faster way to get COVID testing done, I agree with your decision to purchase a home COVID test this evening.  Please not hesitate to give me a call tomorrow morning if your test is positive and I will be happy to provide you with a prescription for Paxlovid.  Thank you for visiting urgent care today.  I hope you feel better soon.

## 2021-07-20 NOTE — ED Triage Notes (Signed)
Thursday the patient reports having a fever, sore throat, n/v, headache and sinus pressure  Home interventions: robitussin, tylenol, ibuprofen.  The patient has a skin biopsy 3 weeks ago and is taking doxycycline.

## 2021-07-21 ENCOUNTER — Telehealth: Payer: Self-pay | Admitting: Emergency Medicine

## 2021-07-21 MED ORDER — PAXLOVID (150/100) 10 X 150 MG & 10 X 100MG PO TBPK: 3.0000 | ORAL_TABLET | Freq: Two times a day (BID) | ORAL | 0 refills | Status: AC

## 2021-07-21 NOTE — Telephone Encounter (Signed)
Patient was seen on July 20, 2021, physical exam findings concerning for COVID.  Patient stated she preferred to use a home COVID test.  Patient called back this morning stating that the test was positive and that her husband tested positive as well.  Prescription for Paxlovid provided for patient.

## 2021-08-27 DIAGNOSIS — H10413 Chronic giant papillary conjunctivitis, bilateral: Secondary | ICD-10-CM | POA: Diagnosis not present

## 2021-08-27 DIAGNOSIS — H5213 Myopia, bilateral: Secondary | ICD-10-CM | POA: Diagnosis not present

## 2021-08-27 DIAGNOSIS — H04332 Acute lacrimal canaliculitis of left lacrimal passage: Secondary | ICD-10-CM | POA: Diagnosis not present

## 2021-08-27 DIAGNOSIS — H40013 Open angle with borderline findings, low risk, bilateral: Secondary | ICD-10-CM | POA: Diagnosis not present

## 2021-08-27 DIAGNOSIS — H04123 Dry eye syndrome of bilateral lacrimal glands: Secondary | ICD-10-CM | POA: Diagnosis not present

## 2021-10-06 DIAGNOSIS — R053 Chronic cough: Secondary | ICD-10-CM | POA: Diagnosis not present

## 2021-10-06 DIAGNOSIS — J342 Deviated nasal septum: Secondary | ICD-10-CM | POA: Diagnosis not present

## 2021-10-06 DIAGNOSIS — K219 Gastro-esophageal reflux disease without esophagitis: Secondary | ICD-10-CM | POA: Diagnosis not present

## 2021-10-06 DIAGNOSIS — R0982 Postnasal drip: Secondary | ICD-10-CM | POA: Diagnosis not present

## 2021-10-06 DIAGNOSIS — R519 Headache, unspecified: Secondary | ICD-10-CM | POA: Diagnosis not present

## 2021-10-06 DIAGNOSIS — J384 Edema of larynx: Secondary | ICD-10-CM | POA: Diagnosis not present

## 2021-10-07 DIAGNOSIS — L570 Actinic keratosis: Secondary | ICD-10-CM | POA: Diagnosis not present

## 2021-10-07 DIAGNOSIS — L299 Pruritus, unspecified: Secondary | ICD-10-CM | POA: Diagnosis not present

## 2021-10-07 DIAGNOSIS — L281 Prurigo nodularis: Secondary | ICD-10-CM | POA: Diagnosis not present

## 2021-10-07 DIAGNOSIS — D485 Neoplasm of uncertain behavior of skin: Secondary | ICD-10-CM | POA: Diagnosis not present

## 2021-11-24 DIAGNOSIS — R053 Chronic cough: Secondary | ICD-10-CM | POA: Diagnosis not present

## 2021-11-24 DIAGNOSIS — J342 Deviated nasal septum: Secondary | ICD-10-CM | POA: Diagnosis not present

## 2021-11-24 DIAGNOSIS — R059 Cough, unspecified: Secondary | ICD-10-CM | POA: Diagnosis not present

## 2021-12-12 DIAGNOSIS — H9312 Tinnitus, left ear: Secondary | ICD-10-CM | POA: Diagnosis not present

## 2021-12-17 DIAGNOSIS — J4540 Moderate persistent asthma, uncomplicated: Secondary | ICD-10-CM | POA: Diagnosis not present

## 2021-12-17 DIAGNOSIS — Z6820 Body mass index (BMI) 20.0-20.9, adult: Secondary | ICD-10-CM | POA: Diagnosis not present

## 2021-12-17 DIAGNOSIS — K219 Gastro-esophageal reflux disease without esophagitis: Secondary | ICD-10-CM | POA: Diagnosis not present

## 2021-12-17 DIAGNOSIS — J3089 Other allergic rhinitis: Secondary | ICD-10-CM | POA: Diagnosis not present

## 2022-01-02 DIAGNOSIS — Z1231 Encounter for screening mammogram for malignant neoplasm of breast: Secondary | ICD-10-CM | POA: Diagnosis not present

## 2022-01-26 DIAGNOSIS — Z0000 Encounter for general adult medical examination without abnormal findings: Secondary | ICD-10-CM | POA: Diagnosis not present

## 2022-02-09 DIAGNOSIS — Z6820 Body mass index (BMI) 20.0-20.9, adult: Secondary | ICD-10-CM | POA: Diagnosis not present

## 2022-02-09 DIAGNOSIS — Z01419 Encounter for gynecological examination (general) (routine) without abnormal findings: Secondary | ICD-10-CM | POA: Diagnosis not present

## 2022-02-09 DIAGNOSIS — Z131 Encounter for screening for diabetes mellitus: Secondary | ICD-10-CM | POA: Diagnosis not present

## 2022-02-09 DIAGNOSIS — K219 Gastro-esophageal reflux disease without esophagitis: Secondary | ICD-10-CM | POA: Diagnosis not present

## 2022-02-09 DIAGNOSIS — N951 Menopausal and female climacteric states: Secondary | ICD-10-CM | POA: Diagnosis not present

## 2022-02-09 DIAGNOSIS — R232 Flushing: Secondary | ICD-10-CM | POA: Diagnosis not present

## 2022-02-09 DIAGNOSIS — F411 Generalized anxiety disorder: Secondary | ICD-10-CM | POA: Diagnosis not present

## 2022-03-30 DIAGNOSIS — K080 Exfoliation of teeth due to systemic causes: Secondary | ICD-10-CM | POA: Diagnosis not present

## 2022-04-07 DIAGNOSIS — J069 Acute upper respiratory infection, unspecified: Secondary | ICD-10-CM | POA: Diagnosis not present

## 2022-04-09 DIAGNOSIS — L700 Acne vulgaris: Secondary | ICD-10-CM | POA: Diagnosis not present

## 2022-04-15 DIAGNOSIS — L739 Follicular disorder, unspecified: Secondary | ICD-10-CM | POA: Diagnosis not present

## 2022-04-15 DIAGNOSIS — L281 Prurigo nodularis: Secondary | ICD-10-CM | POA: Diagnosis not present

## 2022-04-15 DIAGNOSIS — L219 Seborrheic dermatitis, unspecified: Secondary | ICD-10-CM | POA: Diagnosis not present

## 2022-04-21 DIAGNOSIS — J0100 Acute maxillary sinusitis, unspecified: Secondary | ICD-10-CM | POA: Diagnosis not present

## 2022-04-21 DIAGNOSIS — Z6820 Body mass index (BMI) 20.0-20.9, adult: Secondary | ICD-10-CM | POA: Diagnosis not present

## 2022-04-22 DIAGNOSIS — R5383 Other fatigue: Secondary | ICD-10-CM | POA: Diagnosis not present

## 2022-04-22 DIAGNOSIS — Z6821 Body mass index (BMI) 21.0-21.9, adult: Secondary | ICD-10-CM | POA: Diagnosis not present

## 2022-04-22 DIAGNOSIS — F419 Anxiety disorder, unspecified: Secondary | ICD-10-CM | POA: Diagnosis not present

## 2022-04-22 DIAGNOSIS — R232 Flushing: Secondary | ICD-10-CM | POA: Diagnosis not present

## 2022-04-22 DIAGNOSIS — F331 Major depressive disorder, recurrent, moderate: Secondary | ICD-10-CM | POA: Diagnosis not present

## 2022-04-26 ENCOUNTER — Emergency Department (HOSPITAL_BASED_OUTPATIENT_CLINIC_OR_DEPARTMENT_OTHER): Payer: Medicare Other | Admitting: Radiology

## 2022-04-26 ENCOUNTER — Other Ambulatory Visit: Payer: Self-pay

## 2022-04-26 ENCOUNTER — Emergency Department (HOSPITAL_BASED_OUTPATIENT_CLINIC_OR_DEPARTMENT_OTHER)
Admission: EM | Admit: 2022-04-26 | Discharge: 2022-04-27 | Disposition: A | Discharge status: Home / Self Care | Payer: Medicare Other | Attending: Emergency Medicine | Admitting: Emergency Medicine

## 2022-04-26 DIAGNOSIS — S61451A Open bite of right hand, initial encounter: Secondary | ICD-10-CM | POA: Diagnosis not present

## 2022-04-26 DIAGNOSIS — S6991XA Unspecified injury of right wrist, hand and finger(s), initial encounter: Secondary | ICD-10-CM | POA: Diagnosis not present

## 2022-04-26 DIAGNOSIS — W540XXA Bitten by dog, initial encounter: Secondary | ICD-10-CM | POA: Diagnosis not present

## 2022-04-26 DIAGNOSIS — S61411A Laceration without foreign body of right hand, initial encounter: Secondary | ICD-10-CM | POA: Insufficient documentation

## 2022-04-26 DIAGNOSIS — J45909 Unspecified asthma, uncomplicated: Secondary | ICD-10-CM | POA: Diagnosis not present

## 2022-04-26 MED ORDER — AMOXICILLIN-POT CLAVULANATE 875-125 MG PO TABS
1.0000 | ORAL_TABLET | Freq: Once | ORAL | Status: AC
  Administered 2022-04-26: 1 via ORAL
  Filled 2022-04-26: qty 1

## 2022-04-26 NOTE — ED Triage Notes (Signed)
Bit by own dog. Small 10lb dog. Skin tear to right posterior hand. Bleeding controlled. UTD on vaccines.  Wound rinsed in triage and dressed in non-stick gauze.

## 2022-04-26 NOTE — ED Provider Notes (Signed)
[place] EMERGENCY DEPARTMENT AT [facility] Provider Note   CSN: [id number] Arrival date & time: 04/26/22  2230     History {Add pertinent medical, surgical, social history, OB history to HPI:1} Chief Complaint  Patient presents with   Animal Bite    Alejandra Young is a 67 y.o. female.  HPI     This is a 67 year old female who presents with a dog bite to the right hand.  She is right-handed.  She reports that they have had some behavioral issues with their dog and he sometimes will be triggered and bite.  Tonight she went to pet him and he bit her on her right hand.  She has a large skin tear over the dorsal aspect of the base of the thumb.  Dog is up-to-date on vaccinations.  Her last tetanus shot was in 2020.  Home Medications Prior to Admission medications   Medication Sig Start Date End Date Taking? Authorizing Provider  albuterol (PROAIR HFA) 108 (90 Base) MCG/ACT inhaler Inhale 1-2 puffs into the lungs every 6 (six) hours as needed for wheezing or shortness of breath. 10/17/18   Bobbitt, Heywood Iles, MD  clonazePAM (KLONOPIN) 0.5 MG tablet Take 0.5 mg by mouth at bedtime as needed for anxiety (sleep).     [provider]  escitalopram (LEXAPRO) 20 MG tablet Take 20 mg by mouth daily.     [provider]  estradiol (VIVELLE-DOT) 0.05 MG/24HR patch Place 1 patch onto the skin 2 (two) times a week.  10/22/16   [provider]  glucosamine-chondroitin 500-400 MG tablet Take 1 tablet by mouth 2 (two) times daily.    [provider]  tretinoin (RETIN-A) 0.025 % cream Apply 1 application topically 3 (three) times a week.    [provider]      Allergies    Amoxicillin-pot clavulanate and Codeine    Review of Systems   Review of Systems  Constitutional:  Negative for fever.  Skin:  Positive for wound.  All other systems reviewed and are negative.   Physical Exam Updated Vital Signs BP 119/61 (BP Location: Left Arm)    Pulse (!) 58   Temp 98.5 F (36.9 C) (Oral)   Resp 16   Wt 56.7 kg   SpO2 100%   BMI 20.80 kg/m  Physical Exam Vitals and nursing note reviewed.  Constitutional:      Appearance: She is well-developed. She is not ill-appearing.  HENT:     Head: Normocephalic and atraumatic.  Eyes:     Pupils: Pupils are equal, round, and reactive to light.  Cardiovascular:     Rate and Rhythm: Normal rate and regular rhythm.  Pulmonary:     Effort: Pulmonary effort is normal. No respiratory distress.  Abdominal:     Palpations: Abdomen is soft.  Musculoskeletal:     Cervical back: Neck supple.     Comments: Skin tear noted over the base and web spacing at the dorsal aspect of the right thumb, gaping, underlying subcu exposed, no active bleeding  Skin:    General: Skin is warm and dry.  Neurological:     Mental Status: She is alert and oriented to person, place, and time.  Psychiatric:        Mood and Affect: Mood normal.     ED Results / Procedures / Treatments   Labs (all labs ordered are listed, but only abnormal results are displayed) Labs Reviewed - No data to display  EKG None  Radiology No results found.  Procedures Procedures  {Document cardiac monitor, telemetry assessment procedure when appropriate:1}  Medications Ordered in ED Medications  amoxicillin-clavulanate (AUGMENTIN) 875-125 MG per tablet 1 tablet (has no administration in time range)    ED Course/ Medical Decision Making/ A&P Clinical Course as of 04/26/22 2318  Wynelle Link Apr 26, 2022  2315 Patient reports that she can tolerate Augmentin.   [CH]    Clinical Course User Index [CH] Rashea Hoskie, Mayer Masker, MD   {   Click here for ABCD2, HEART and other calculatorsREFRESH Note before signing :1}                          Medical Decision Making Amount and/or Complexity of Data Reviewed Radiology: ordered.  Risk Prescription drug management.   ***  {Document critical care time when  appropriate:1} {Document review of labs and clinical decision tools ie heart score, Chads2Vasc2 etc:1}  {Document your independent review of radiology images, and any outside records:1} {Document your discussion with family members, caretakers, and with consultants:1} {Document social determinants of health affecting pt's care:1} {Document your decision making why or why not admission, treatments were needed:1} Final Clinical Impression(s) / ED Diagnoses Final diagnoses:  None    Rx / DC Orders ED Discharge Orders     None

## 2022-04-27 MED ORDER — AMOXICILLIN-POT CLAVULANATE 875-125 MG PO TABS: 1.0000 | ORAL_TABLET | Freq: Two times a day (BID) | ORAL | 0 refills | Status: AC

## 2022-04-27 NOTE — Discharge Instructions (Signed)
You were seen today for dog bite.  Keep wound cleaned and dressed.  Monitor closely for signs and symptoms of infection.  If you note redness, drainage, any new or worsening symptoms, you need to be reevaluated immediately.  Take antibiotics as prescribed.

## 2022-04-27 NOTE — ED Provider Notes (Incomplete)
[place] EMERGENCY DEPARTMENT AT [facility] Provider Note   CSN: [id number] Arrival date & time: 04/26/22  2230     History {Add pertinent medical, surgical, social history, OB history to HPI:1} Chief Complaint  Patient presents with  . Animal Bite    Alejandra Young is a 67 y.o. female.  HPI     This is a 67 year old female who presents with a dog bite to the right hand.  She is right-handed.  She reports that they have had some behavioral issues with their dog and he sometimes will be triggered and bite.  Tonight she went to pet him and he bit her on her right hand.  She has a large skin tear over the dorsal aspect of the base of the thumb.  Dog is up-to-date on vaccinations.  Her last tetanus shot was in 2020.  Home Medications Prior to Admission medications   Medication Sig Start Date End Date Taking? Authorizing Provider  amoxicillin-clavulanate (AUGMENTIN) 875-125 MG tablet Take 1 tablet by mouth every 12 (twelve) hours. 04/27/22  Yes Deniya Craigo, Mayer Masker, MD  albuterol (PROAIR HFA) 108 (90 Base) MCG/ACT inhaler Inhale 1-2 puffs into the lungs every 6 (six) hours as needed for wheezing or shortness of breath. 10/17/18   Bobbitt, Heywood Iles, MD  clonazePAM (KLONOPIN) 0.5 MG tablet Take 0.5 mg by mouth at bedtime as needed for anxiety (sleep).     [provider]  escitalopram (LEXAPRO) 20 MG tablet Take 20 mg by mouth daily.     [provider]  estradiol (VIVELLE-DOT) 0.05 MG/24HR patch Place 1 patch onto the skin 2 (two) times a week.  10/22/16   [provider]  glucosamine-chondroitin 500-400 MG tablet Take 1 tablet by mouth 2 (two) times daily.    [provider]  tretinoin (RETIN-A) 0.025 % cream Apply 1 application topically 3 (three) times a week.    [provider]      Allergies    Amoxicillin-pot clavulanate and Codeine    Review of Systems   Review of Systems  Constitutional:  Negative for fever.  Skin:   Positive for wound.  All other systems reviewed and are negative.   Physical Exam Updated Vital Signs BP 119/61 (BP Location: Left Arm)   Pulse (!) 58   Temp 98.5 F (36.9 C) (Oral)   Resp 16   Wt 56.7 kg   SpO2 100%   BMI 20.80 kg/m  Physical Exam Vitals and nursing note reviewed.  Constitutional:      Appearance: She is well-developed. She is not ill-appearing.  HENT:     Head: Normocephalic and atraumatic.  Eyes:     Pupils: Pupils are equal, round, and reactive to light.  Cardiovascular:     Rate and Rhythm: Normal rate and regular rhythm.  Pulmonary:     Effort: Pulmonary effort is normal. No respiratory distress.  Abdominal:     Palpations: Abdomen is soft.  Musculoskeletal:     Cervical back: Neck supple.     Comments: Skin tear noted over the base and web spacing at the dorsal aspect of the right thumb, gaping, underlying subcu exposed, no active bleeding  Skin:    General: Skin is warm and dry.  Neurological:     Mental Status: She is alert and oriented to person, place, and time.  Psychiatric:        Mood and Affect: Mood normal.     ED Results / Procedures / Treatments  Labs (all labs ordered are listed, but only abnormal results are displayed) Labs Reviewed - No data to display  EKG None  Radiology DG Hand Complete Right  Result Date: 04/26/2022 CLINICAL DATA:  Status post dog bite. EXAM: RIGHT HAND - COMPLETE 3+ VIEW COMPARISON:  None Available. FINDINGS: There is no evidence of an acute fracture or dislocation. A chronic fracture deformity is seen involving the base of the distal phalanx of the right thumb. There is no evidence of significant arthropathy or other focal bone abnormality. Soft tissues are unremarkable. IMPRESSION: No acute osseous abnormality. Electronically Signed   By: Aram Candela M.D.   On: 04/26/2022 23:33    Procedures Procedures  {Document cardiac monitor, telemetry assessment procedure when  appropriate:1}  Medications Ordered in ED Medications  amoxicillin-clavulanate (AUGMENTIN) 875-125 MG per tablet 1 tablet (1 tablet Oral Given 04/26/22 2323)    ED Course/ Medical Decision Making/ A&P Clinical Course as of 04/27/22 0002  Sun Apr 26, 2022  2315 Patient reports that she can tolerate Augmentin.   [CH]    Clinical Course User Index [CH] Shree Espey, Mayer Masker, MD   {   Click here for ABCD2, HEART and other calculatorsREFRESH Note before signing :1}                          Medical Decision Making Amount and/or Complexity of Data Reviewed Radiology: ordered.  Risk Prescription drug management.   ***  {Document critical care time when appropriate:1} {Document review of labs and clinical decision tools ie heart score, Chads2Vasc2 etc:1}  {Document your independent review of radiology images, and any outside records:1} {Document your discussion with family members, caretakers, and with consultants:1} {Document social determinants of health affecting pt's care:1} {Document your decision making why or why not admission, treatments were needed:1} Final Clinical Impression(s) / ED Diagnoses Final diagnoses:  Dog bite, initial encounter  Skin tear of right hand without complication, initial encounter    Rx / DC Orders ED Discharge Orders          Ordered    amoxicillin-clavulanate (AUGMENTIN) 875-125 MG tablet  Every 12 hours        04/27/22 0001

## 2022-05-06 DIAGNOSIS — W540XXD Bitten by dog, subsequent encounter: Secondary | ICD-10-CM | POA: Diagnosis not present

## 2022-05-06 DIAGNOSIS — S61431A Puncture wound without foreign body of right hand, initial encounter: Secondary | ICD-10-CM | POA: Diagnosis not present

## 2022-05-20 DIAGNOSIS — F331 Major depressive disorder, recurrent, moderate: Secondary | ICD-10-CM | POA: Diagnosis not present

## 2022-05-20 DIAGNOSIS — F419 Anxiety disorder, unspecified: Secondary | ICD-10-CM | POA: Diagnosis not present

## 2022-06-15 DIAGNOSIS — F3342 Major depressive disorder, recurrent, in full remission: Secondary | ICD-10-CM | POA: Diagnosis not present

## 2022-06-15 DIAGNOSIS — F419 Anxiety disorder, unspecified: Secondary | ICD-10-CM | POA: Diagnosis not present

## 2022-06-16 DIAGNOSIS — H6981 Other specified disorders of Eustachian tube, right ear: Secondary | ICD-10-CM | POA: Diagnosis not present

## 2022-06-19 DIAGNOSIS — J0101 Acute recurrent maxillary sinusitis: Secondary | ICD-10-CM | POA: Diagnosis not present

## 2022-06-25 DIAGNOSIS — H9120 Sudden idiopathic hearing loss, unspecified ear: Secondary | ICD-10-CM | POA: Diagnosis not present

## 2022-06-25 DIAGNOSIS — H9041 Sensorineural hearing loss, unilateral, right ear, with unrestricted hearing on the contralateral side: Secondary | ICD-10-CM | POA: Diagnosis not present

## 2022-06-25 DIAGNOSIS — J019 Acute sinusitis, unspecified: Secondary | ICD-10-CM | POA: Diagnosis not present

## 2022-06-27 ENCOUNTER — Other Ambulatory Visit: Payer: Self-pay | Admitting: Physician Assistant

## 2022-06-27 DIAGNOSIS — H9120 Sudden idiopathic hearing loss, unspecified ear: Secondary | ICD-10-CM

## 2022-07-08 DIAGNOSIS — K640 First degree hemorrhoids: Secondary | ICD-10-CM | POA: Diagnosis not present

## 2022-07-08 DIAGNOSIS — Z86010 Personal history of colonic polyps: Secondary | ICD-10-CM | POA: Diagnosis not present

## 2022-07-08 DIAGNOSIS — K5730 Diverticulosis of large intestine without perforation or abscess without bleeding: Secondary | ICD-10-CM | POA: Diagnosis not present

## 2022-07-08 DIAGNOSIS — Z09 Encounter for follow-up examination after completed treatment for conditions other than malignant neoplasm: Secondary | ICD-10-CM | POA: Diagnosis not present

## 2022-07-13 DIAGNOSIS — L700 Acne vulgaris: Secondary | ICD-10-CM | POA: Diagnosis not present

## 2022-07-20 ENCOUNTER — Ambulatory Visit
Admission: RE | Admit: 2022-07-20 | Discharge: 2022-07-20 | Disposition: A | Discharge status: Home / Self Care | Payer: Medicare Other | Source: Ambulatory Visit | Attending: Physician Assistant | Admitting: Physician Assistant

## 2022-07-20 DIAGNOSIS — H9041 Sensorineural hearing loss, unilateral, right ear, with unrestricted hearing on the contralateral side: Secondary | ICD-10-CM | POA: Diagnosis not present

## 2022-07-20 DIAGNOSIS — H9120 Sudden idiopathic hearing loss, unspecified ear: Secondary | ICD-10-CM

## 2022-07-20 MED ORDER — GADOPICLENOL 0.5 MMOL/ML IV SOLN
5.0000 mL | Freq: Once | INTRAVENOUS | Status: AC | PRN
  Administered 2022-07-20: 5 mL via INTRAVENOUS

## 2022-07-24 DIAGNOSIS — J329 Chronic sinusitis, unspecified: Secondary | ICD-10-CM | POA: Diagnosis not present

## 2022-08-03 ENCOUNTER — Other Ambulatory Visit: Payer: Self-pay | Admitting: Family Medicine

## 2022-08-03 ENCOUNTER — Ambulatory Visit
Admission: RE | Admit: 2022-08-03 | Discharge: 2022-08-03 | Disposition: A | Discharge status: Home / Self Care | Payer: Medicare Other | Source: Ambulatory Visit | Attending: Family Medicine | Admitting: Family Medicine

## 2022-08-03 ENCOUNTER — Ambulatory Visit: Payer: Medicare Other

## 2022-08-03 DIAGNOSIS — R058 Other specified cough: Secondary | ICD-10-CM

## 2022-08-03 DIAGNOSIS — R059 Cough, unspecified: Secondary | ICD-10-CM | POA: Diagnosis not present

## 2022-08-06 DIAGNOSIS — H9041 Sensorineural hearing loss, unilateral, right ear, with unrestricted hearing on the contralateral side: Secondary | ICD-10-CM | POA: Diagnosis not present

## 2022-08-06 DIAGNOSIS — H9120 Sudden idiopathic hearing loss, unspecified ear: Secondary | ICD-10-CM | POA: Diagnosis not present

## 2022-09-01 DIAGNOSIS — H04123 Dry eye syndrome of bilateral lacrimal glands: Secondary | ICD-10-CM | POA: Diagnosis not present

## 2022-09-01 DIAGNOSIS — H10413 Chronic giant papillary conjunctivitis, bilateral: Secondary | ICD-10-CM | POA: Diagnosis not present

## 2022-09-01 DIAGNOSIS — H40013 Open angle with borderline findings, low risk, bilateral: Secondary | ICD-10-CM | POA: Diagnosis not present

## 2022-09-01 DIAGNOSIS — H25813 Combined forms of age-related cataract, bilateral: Secondary | ICD-10-CM | POA: Diagnosis not present

## 2022-09-21 DIAGNOSIS — F3341 Major depressive disorder, recurrent, in partial remission: Secondary | ICD-10-CM | POA: Diagnosis not present

## 2022-09-21 DIAGNOSIS — F419 Anxiety disorder, unspecified: Secondary | ICD-10-CM | POA: Diagnosis not present

## 2022-10-06 DIAGNOSIS — K080 Exfoliation of teeth due to systemic causes: Secondary | ICD-10-CM | POA: Diagnosis not present

## 2022-11-18 DIAGNOSIS — K080 Exfoliation of teeth due to systemic causes: Secondary | ICD-10-CM | POA: Diagnosis not present

## 2022-11-30 DIAGNOSIS — F419 Anxiety disorder, unspecified: Secondary | ICD-10-CM | POA: Diagnosis not present

## 2022-11-30 DIAGNOSIS — F3341 Major depressive disorder, recurrent, in partial remission: Secondary | ICD-10-CM | POA: Diagnosis not present

## 2022-11-30 DIAGNOSIS — Z23 Encounter for immunization: Secondary | ICD-10-CM | POA: Diagnosis not present

## 2023-01-08 DIAGNOSIS — Z1231 Encounter for screening mammogram for malignant neoplasm of breast: Secondary | ICD-10-CM | POA: Diagnosis not present

## 2023-01-13 DIAGNOSIS — F3341 Major depressive disorder, recurrent, in partial remission: Secondary | ICD-10-CM | POA: Diagnosis not present

## 2023-01-13 DIAGNOSIS — R232 Flushing: Secondary | ICD-10-CM | POA: Diagnosis not present

## 2023-01-13 DIAGNOSIS — F419 Anxiety disorder, unspecified: Secondary | ICD-10-CM | POA: Diagnosis not present

## 2023-01-13 DIAGNOSIS — N952 Postmenopausal atrophic vaginitis: Secondary | ICD-10-CM | POA: Diagnosis not present

## 2023-03-22 DIAGNOSIS — Z131 Encounter for screening for diabetes mellitus: Secondary | ICD-10-CM | POA: Diagnosis not present

## 2023-03-22 DIAGNOSIS — Z8639 Personal history of other endocrine, nutritional and metabolic disease: Secondary | ICD-10-CM | POA: Diagnosis not present

## 2023-03-25 DIAGNOSIS — F331 Major depressive disorder, recurrent, moderate: Secondary | ICD-10-CM | POA: Diagnosis not present

## 2023-03-25 DIAGNOSIS — R232 Flushing: Secondary | ICD-10-CM | POA: Diagnosis not present

## 2023-03-25 DIAGNOSIS — F419 Anxiety disorder, unspecified: Secondary | ICD-10-CM | POA: Diagnosis not present

## 2023-03-25 DIAGNOSIS — Z23 Encounter for immunization: Secondary | ICD-10-CM | POA: Diagnosis not present

## 2023-03-25 DIAGNOSIS — Z0000 Encounter for general adult medical examination without abnormal findings: Secondary | ICD-10-CM | POA: Diagnosis not present

## 2023-03-29 ENCOUNTER — Other Ambulatory Visit: Payer: Self-pay | Admitting: Family Medicine

## 2023-03-29 DIAGNOSIS — R232 Flushing: Secondary | ICD-10-CM

## 2023-05-24 DIAGNOSIS — K080 Exfoliation of teeth due to systemic causes: Secondary | ICD-10-CM | POA: Diagnosis not present

## 2023-06-24 DIAGNOSIS — F411 Generalized anxiety disorder: Secondary | ICD-10-CM | POA: Diagnosis not present

## 2023-07-07 DIAGNOSIS — F411 Generalized anxiety disorder: Secondary | ICD-10-CM | POA: Diagnosis not present

## 2023-08-10 DIAGNOSIS — L821 Other seborrheic keratosis: Secondary | ICD-10-CM | POA: Diagnosis not present

## 2023-08-10 DIAGNOSIS — R202 Paresthesia of skin: Secondary | ICD-10-CM | POA: Diagnosis not present

## 2023-08-10 DIAGNOSIS — L281 Prurigo nodularis: Secondary | ICD-10-CM | POA: Diagnosis not present

## 2023-09-20 ENCOUNTER — Encounter (INDEPENDENT_AMBULATORY_CARE_PROVIDER_SITE_OTHER): Payer: Self-pay

## 2023-09-21 ENCOUNTER — Other Ambulatory Visit: Payer: Self-pay | Admitting: Medical Genetics

## 2023-10-11 DIAGNOSIS — L601 Onycholysis: Secondary | ICD-10-CM | POA: Diagnosis not present

## 2023-10-11 DIAGNOSIS — L820 Inflamed seborrheic keratosis: Secondary | ICD-10-CM | POA: Diagnosis not present

## 2023-10-14 DIAGNOSIS — F411 Generalized anxiety disorder: Secondary | ICD-10-CM | POA: Diagnosis not present

## 2023-10-14 DIAGNOSIS — F331 Major depressive disorder, recurrent, moderate: Secondary | ICD-10-CM | POA: Diagnosis not present

## 2023-10-20 DIAGNOSIS — H52223 Regular astigmatism, bilateral: Secondary | ICD-10-CM | POA: Diagnosis not present

## 2023-10-21 ENCOUNTER — Other Ambulatory Visit

## 2023-11-18 DIAGNOSIS — F331 Major depressive disorder, recurrent, moderate: Secondary | ICD-10-CM | POA: Diagnosis not present

## 2023-11-18 DIAGNOSIS — F411 Generalized anxiety disorder: Secondary | ICD-10-CM | POA: Diagnosis not present

## 2023-11-28 DIAGNOSIS — S51851A Open bite of right forearm, initial encounter: Secondary | ICD-10-CM | POA: Diagnosis not present

## 2023-11-28 DIAGNOSIS — S41151A Open bite of right upper arm, initial encounter: Secondary | ICD-10-CM | POA: Diagnosis not present

## 2023-12-08 DIAGNOSIS — F411 Generalized anxiety disorder: Secondary | ICD-10-CM | POA: Diagnosis not present

## 2023-12-08 DIAGNOSIS — F331 Major depressive disorder, recurrent, moderate: Secondary | ICD-10-CM | POA: Diagnosis not present

## 2023-12-20 ENCOUNTER — Telehealth (HOSPITAL_COMMUNITY): Payer: Self-pay

## 2023-12-20 NOTE — Telephone Encounter (Signed)
 Pt called Coordinator back and confirmed they were able to do eligibility assessment quickly. Coordinator conducted PHQ-9, pt scored an 18. Coordinator informed pt that second assessment will be sent via email (pt agreed at beginning of call due to shortness of time) along with next steps before scheduling consultation appt. Pt agreed and is eager to move forward with TMS process.

## 2023-12-20 NOTE — Telephone Encounter (Signed)
 Pt returned TMS Coordinator's call. Pt left vm stating they were on their way to psychotherapy appt and will be tied up until 4:00pm. Pt said they were available to speak until 4:30pm as they had another appt after that. Coordinator will call back around 4pm to discuss interest in tx.

## 2023-12-20 NOTE — Telephone Encounter (Signed)
 [employer] Coordinator sent outgoing email to pt. Email contained 2nd screening assessment (BDI) along with information to provide insurance payer to determine appropriate benefit coverage. Will await returned assessments before scheduling consultation.

## 2023-12-20 NOTE — Telephone Encounter (Signed)
 Coordinator placed call to pt. Pt requested if they could call Coordinator back in about 10-15 minutes, as they were finishing up appt. Coordinator agreed and will await returned call.

## 2023-12-20 NOTE — Telephone Encounter (Signed)
 [employer] Coordinator received incoming call from pt at 9am. Pt left vm requesting information and stating interest in receiving TMS TX. Coordinator reviewed chart; pt would need 6-8 week trial of psychotherapy and potentially additional med trial. Coordinator will call back to collect additional information & screen.

## 2023-12-20 NOTE — Telephone Encounter (Signed)
 [employer] Coordinator placed outgoing call to pt. Pt did not answer, Coordinator left vm. VM requested a call back to discuss TMS eligibility & intake process. Coordinator will alert pt that insurance payer requires 4 'failed' rx trials, along with course of psychotherapy. Will await call back to screen and answer questions.

## 2023-12-21 ENCOUNTER — Telehealth (HOSPITAL_COMMUNITY): Payer: Self-pay

## 2023-12-21 NOTE — Telephone Encounter (Signed)
 Pt sent incoming email to TMS Coordinator containing completed BDI assessment. Pt scored an 18 on PHQ and a 26 on BDI, showing moderate to mod-severe depression severity. Scores qualify pt for treatment. Pt stated they would call theitr insurance later on today (12/21/23) to confirm benefits. Coordinator responded to pt confirming eligibility and will await their confirmed coverage verification.

## 2023-12-22 ENCOUNTER — Telehealth (HOSPITAL_COMMUNITY): Payer: Self-pay

## 2023-12-22 NOTE — Telephone Encounter (Signed)
 Pt sent [employer] Coordinator incoming email. Pt stated that their insurance payer [facility]) stated that provided cpt codes were billable codes and prior authorization is needed. They said some treatments are experimental.' That's it.  Coordinator responded informing pt that was fine as treatments are for MDD/treatment-resistance, which is FDA approved. Coordinator will find out additional details and provided available consult appt times for week of 12/15-12/19. Coordinator will await response before scheduling.

## 2023-12-24 ENCOUNTER — Telehealth (HOSPITAL_COMMUNITY): Payer: Self-pay

## 2023-12-24 ENCOUNTER — Other Ambulatory Visit (HOSPITAL_COMMUNITY): Admitting: Student in an Organized Health Care Education/Training Program

## 2023-12-24 DIAGNOSIS — F329 Major depressive disorder, single episode, unspecified: Secondary | ICD-10-CM | POA: Diagnosis not present

## 2023-12-24 NOTE — Progress Notes (Signed)
 " Psychiatric Initial Adult Assessment   Patient Identification: Alejandra Young MRN:  [id number] Date of Evaluation:  12/24/2023 Referral Source: therapist Chief Complaint:   Chief Complaint  Patient presents with   TMS Consult   Visit Diagnosis:    ICD-10-CM   1. Treatment-resistant depression  F32.9       History of Present Illness:   Alejandra Young is a 68 yr old female who presents for TMS consult.  PPHx is significant for Depression, Anxiety, and 1 Prior Psychiatric Hospitalization (teenager), and no history of Suicide Attempts or Self Injurious Behavior.  Alejandra Young presents with her husband.  Alejandra Young reports that Alejandra Young has been suffering from depression since about age 45.  Alejandra Young reports that nothing has ever completely controlled her symptoms.  Alejandra Young reports that Alejandra Young has had side effects to many of her medications tried.  Alejandra Young reports that the medication Alejandra Young had been on last September stopped controlling her symptoms and led to her current worsening of symptoms.  Alejandra Young reports that since then Alejandra Young has been tried on 3 different medications without success.  Medication trials- Lexapro, Celexa, Prozac, Zoloft, Effexor, Paxil, and Wellbutrin (intolerance).  Alejandra Young reports Alejandra Young is currently taking Luvox and Vistaril.  Alejandra Young is currently in therapy.  Alejandra Young reports that psychiatric history significant for depression and anxiety.  Alejandra Young reports no history of suicide attempts.  Alejandra Young reports no history of self-injurious behavior.  Alejandra Young reports 1 prior psychiatric hospitalization as a teenager.  Alejandra Young reports no significant past medical history.  Alejandra Young reports past surgical history significant for hysterectomy and to repair surgery.  Alejandra Young reports no history of head trauma.  Alejandra Young reports no history of seizures.  Alejandra Young reports allergies to codeine- extreme panic/derealization.  Alejandra Young reports no alcohol use since 1989.  Alejandra Young reports no tobacco use since 1984.  Alejandra Young reports no illicit substance use.  Discussed TMS with her.  Discussed TMS  process and study results on treatment resistant depression.  Discussed the first appointment and what is involved with finding the MT.  Discussed with her that afterwards treatment sessions would last approximately 20 minutes.  Discussed that treatments are Monday through Friday for 6 weeks and then there is a taper period.  Discussed that there is no definitive timeline for how long results would last but that typically we expect at least 1 year of improvement to consider additional treatments in the future.  Discussed that Alejandra Young should continue taking their medications as prescribed by their outpatient provider discussed that the tech would run the treatments afterwards with their parameters saved into the system, however, if any questions or issues arose I would be immediately available by phone or could be on site.  Discussed potential risks and side effects including but not limited to: Discomfort at site of magnet placement, headache, or seizure.  Confirmed that there was no implanted/retained metal in the head (aneurysm clips, plates, screws).  Confirmed that there is no significant dental/bridge work.  Confirmed that there is no implanted pacemaker/defibrillator.  Discussed that jewelry should not be worn or that it should be removed from the ear for treatment.  Discussed that treatments are done at [facility].  Discussed that treatment room can have lights dimmed, music played, or have the TV on during treatment sessions for comfort.  All questions were invited and answered. Alejandra Young were agreeable to proceed with MT appointment.   Alejandra Young reports no SI, HI, or AVH.  Associated Signs/Symptoms: Depression Symptoms:  depressed mood, anhedonia, fatigue, anxiety, (Hypo) Manic  Symptoms:  Reports None Anxiety Symptoms:  Excessive Worry, Psychotic Symptoms:  Reports None PTSD Symptoms: NA  Past Psychiatric History:  Depression, Anxiety, and 1 Prior Psychiatric Hospitalization (teenager), and no history of Suicide  Attempts or Self Injurious Behavior.  Previous Psychotropic Medications: Yes  Lexapro, Celexa, Prozac, Zoloft, Effexor, Paxil, Wellbutrin (intolerance), Luvox, and Vistaril.  Substance Abuse History in the last 12 months:  No.  Consequences of Substance Abuse: NA  Past Medical History:  Past Medical History:  Diagnosis Date   Anxiety    Asthma    Sinusitis, acute     Past Surgical History:  Procedure Laterality Date   ABDOMINAL HYSTERECTOMY     ADENOIDECTOMY     LARYNGOSCOPY     TONSILLECTOMY      Family Psychiatric History:  Brother- Depression, Suicide Attempt, EtOH Abuse Mother- EtOH Abuse Maternal Great Uncle- Unknown disorder  Family History:  Family History  Problem Relation Age of Onset   Allergic rhinitis Neg Hx    Angioedema Neg Hx    Asthma Neg Hx    Eczema Neg Hx    Urticaria Neg Hx    Immunodeficiency Neg Hx     Social History:   Social History   Socioeconomic History   Marital status: Married    Spouse name: Not on file   Number of children: Not on file   Years of education: Not on file   Highest education level: Not on file  Occupational History   Not on file  Tobacco Use   Smoking status: Former    Current packs/day: 1.00    Average packs/day: 1 pack/day for 10.0 years (10.0 ttl pk-yrs)    Types: Cigarettes   Smokeless tobacco: Never  Vaping Use   Vaping status: Never Used  Substance and Sexual Activity   Alcohol use: No   Drug use: No   Sexual activity: Not on file  Other Topics Concern   Not on file  Social History Narrative   Not on file   Social Drivers of Health   Tobacco Use: Medium Risk (11/28/2023)   Received from [facility]   Patient History    Smoking Tobacco Use: Former    Smokeless Tobacco Use: Never    Passive Exposure: Not on Actuary Strain: Not on file  Food Insecurity: Low Risk (08/06/2022)   Received from [facility]   Epic    Within the past 12 months, you worried that your food would  run out before you got money to buy more: Never true    Within the past 12 months, the food you bought just didn't last and you didn't have money to get more. : Never true  Transportation Needs: No Transportation Needs (08/06/2022)   Received from [facility]    In the past 12 months, has lack of reliable transportation kept you from medical appointments, meetings, work or from getting things needed for daily living? : No  Physical Activity: Not on file  Stress: Not on file  Social Connections: Unknown (05/20/2021)   Received from [facility]   Social Network    Social Network: Not on file  Depression (PHQ2-9): Not on file  Alcohol Screen: Not on file  Housing: Low Risk (08/06/2022)   Received from [facility]   Epic    What is your living situation today?: I have a steady place to live    Think about the place you live. Alejandra Young you have problems with any  of the following? Choose all that apply:: None/None on this list  Utilities: Low Risk (08/06/2022)   Received from [facility]   Utilities    In the past 12 months has the electric, gas, oil, or water company threatened to shut off services in your home? : No  Health Literacy: Not on file    Additional Social History:   Allergies:  Allergies[1]  Metabolic Disorder Labs: No results found for: HGBA1C, MPG No results found for: PROLACTIN No results found for: CHOL, TRIG, HDL, CHOLHDL, VLDL, LDLCALC No results found for: TSH  Therapeutic Level Labs: No results found for: LITHIUM No results found for: CBMZ No results found for: VALPROATE  Current Medications: Current Outpatient Medications  Medication Sig Dispense Refill   albuterol  (PROAIR  HFA) 108 (90 Base) MCG/ACT inhaler Inhale 1-2 puffs into the lungs every 6 (six) hours as needed for wheezing or shortness of breath. 18 g 1   amoxicillin -clavulanate (AUGMENTIN ) 875-125 MG tablet Take 1 tablet by mouth every 12 (twelve) hours. 14  tablet 0   clonazePAM (KLONOPIN) 0.5 MG tablet Take 0.5 mg by mouth at bedtime as needed for anxiety (sleep).      escitalopram (LEXAPRO) 20 MG tablet Take 20 mg by mouth daily.      estradiol (VIVELLE-DOT) 0.05 MG/24HR patch Place 1 patch onto the skin 2 (two) times a week.      glucosamine-chondroitin 500-400 MG tablet Take 1 tablet by mouth 2 (two) times daily.     tretinoin (RETIN-A) 0.025 % cream Apply 1 application topically 3 (three) times a week.     No current facility-administered medications for this visit.    Musculoskeletal: Strength & Muscle Tone: within normal limits Gait & Station: normal Patient leans: N/A  Psychiatric Specialty Exam: Review of Systems  Respiratory:  Negative for shortness of breath.   Cardiovascular:  Negative for chest pain.  Gastrointestinal:  Negative for abdominal pain, constipation, diarrhea, nausea and vomiting.  Neurological:  Negative for dizziness, weakness and headaches.  Psychiatric/Behavioral:  Positive for dysphoric mood. Negative for hallucinations and suicidal ideas. The patient is nervous/anxious.     There were no vitals taken for this visit.There is no height or weight on file to calculate BMI.  General Appearance: Casual and Fairly Groomed  Eye Contact:  Good  Speech:  Clear and Coherent and Normal Rate  Volume:  Normal  Mood:  Dysphoric  Affect:  Congruent  Thought Process:  Coherent and Goal Directed  Orientation:  Full (Time, Place, and Person)  Thought Content:  WDL and Logical  Suicidal Thoughts:  No  Homicidal Thoughts:  No  Memory:  Immediate;   Good Recent;   Good  Judgement:  Good  Insight:  Good  Psychomotor Activity:  Normal  Concentration:  Concentration: Good and Attention Span: Good  Recall:  Good  Fund of Knowledge:Good  Language: Good  Akathisia:  Negative  Handed:    AIMS (if indicated):  not done  Assets:  Communication Skills Desire for Improvement Housing Physical Health Resilience Social  Support  ADL's:  Intact  Cognition: WNL  Sleep:  Fair   Screenings: Flowsheet Row ED from 04/26/2022 in [facility] at [facility] from 07/20/2021 in [facility])  C-SSRS RISK CATEGORY No Risk No Risk    Assessment and Plan:  Alejandra Young is a 68 yr old female who presents for TMS consult.  PPHx is significant for Depression, Anxiety, and 1  Prior Psychiatric Hospitalization (teenager), and no history of Suicide Attempts or Self Injurious Behavior.  Alejandra Young has had significant depression for the majority of her life but has had significant worsening since last September.  Alejandra Young has had many medication trials and side effects to many of those medications.  Alejandra Young would be a good candidate for TMS and is interested in pursuing this.  We will proceed with scheduling an MT session.  Collaboration of Care:   Patient/Guardian was advised Release of Information must be obtained prior to any record release in order to collaborate their care with an outside provider. Patient/Guardian was advised if they have not already done so to contact the registration department to sign all necessary forms in order for us  to release information regarding their care.   Consent: Patient/Guardian gives verbal consent for treatment and assignment of benefits for services provided during this visit. Patient/Guardian expressed understanding and agreed to proceed.   Alejandra GORMAN Rosser, Alejandra Young 12/19/20253:57 PM     [1]  Allergies Allergen Reactions   Amoxicillin -Pot Clavulanate Nausea And Vomiting   Codeine Anxiety and Other (See Comments)    anxiety   "

## 2023-12-24 NOTE — Telephone Encounter (Signed)
 Pt sent incoming email to Coordinator; email contained completed ROI form and to let tx team know that pt will be attending with her husband, as long as that was okay. Coordinator responded confirming the completed form and letting patient know they are more than welcomed to bring a support person any time. Provider made aware.

## 2024-01-10 ENCOUNTER — Telehealth (HOSPITAL_COMMUNITY): Payer: Self-pay

## 2024-01-10 NOTE — Telephone Encounter (Signed)
 Coordinator placed call to pt's outpatient (external) therapist - Samule Isenhour @ [facility]. Coordinator left vm request records be faxed, or a call back to see if secure email would be better suited. Will await response.

## 2024-01-10 NOTE — Telephone Encounter (Signed)
 Pt sent TMS Coordinator incoming email with completed ROI form to start prior authorization request for TMS tx. Coordinator will contact OP therapist to request clinical info to attach to authorization request. Coordinator responded thanking pt and assuring them they will work on it now and hope to fast track process - but no guarantee. Will keep them updated and alerting as soon as decision is received.

## 2024-01-10 NOTE — Telephone Encounter (Signed)
 Pt's outpatient therapist returned call. Therapist advised faxing ROI form to [phone] to add to pt's chart, then records will be faxed back for TMS prior authorization. Coordinator faxed ROI form and records request at 1:33 PM.

## 2024-01-13 ENCOUNTER — Telehealth (HOSPITAL_COMMUNITY): Payer: Self-pay

## 2024-01-13 NOTE — Telephone Encounter (Signed)
 Pt's therapist called Coordinator back; therapist stated they've had a super slammed week and will be unable to send records today. Therapist confirmed initial fax was received and will ensure records are sent by tomorrow.

## 2024-01-13 NOTE — Telephone Encounter (Signed)
 [employer] coordinator sent pt email updating them on current records/authorization process. Pt was informed that records haven't been received, but remaining ppw is completed. Once received, will ensure authorization request is sent out asap. Coordinator assured pt that they will call payer [facility]) to see how soon a decision could be had. Coordinator concluded alerting pt to reach out with any questions/concerns.

## 2024-01-13 NOTE — Telephone Encounter (Signed)
 TMS Coordinator placed call to pt's Outpt therapist to request records, as they still have not been received. Coordinator provided call back number and fax number via voicemail.

## 2024-01-18 ENCOUNTER — Telehealth (HOSPITAL_COMMUNITY): Payer: Self-pay

## 2024-01-18 NOTE — Telephone Encounter (Signed)
 [employer] Coordinator received early AM fax from pt's outpt therapist. Coordinator will complete authorization ppw and fax to pt's insurance [facility]).

## 2024-01-18 NOTE — Telephone Encounter (Signed)
 TMS prior authorization request faxed successfully to pt's insurance payer [facility]). Will await auth determination before contacting pt for scheduling.

## 2024-01-20 ENCOUNTER — Telehealth (HOSPITAL_COMMUNITY): Payer: Self-pay

## 2024-01-20 NOTE — Telephone Encounter (Signed)
 [employer] coordinator received incoming fax from pt's payer [facility]). Payer requested for clinicals to be re-faxed, as certain pieces were blurry/not legible. Coordinator re-faxed clinicals and will await auth determination before contacting program attending and pt for scheduling.

## 2024-01-25 ENCOUNTER — Telehealth (HOSPITAL_COMMUNITY): Payer: Self-pay

## 2024-01-25 NOTE — Telephone Encounter (Signed)
 [employer] Coordinator sent pt outgoing email containing all appointment information for scheduled initial mapping.

## 2024-01-25 NOTE — Telephone Encounter (Signed)
 Pt quickly returned Coordinator's call to discuss approved auth and scheduling of initial mapping appt. Coordinator provided details regarding initial mapping (timeframe, comfort items, daily scheduling, etc) and asked when pt would be available for 1st appt based on attending's schedule. PT elected to come in on Thursday (01/27/24) at 10 am. Pt requested a follow up email will all details included, Coordinator agreed. Will schedule into system and alert attending.

## 2024-01-25 NOTE — Telephone Encounter (Signed)
 TMS Coordinator placed call to pt; no answer. Coordinator left vm requesting a call back pertaining to approved authorization/scheduling. Coordinator also stated they would send an email with all instructions/details as well. Will await pt's response before scheduling into system.

## 2024-01-25 NOTE — Telephone Encounter (Signed)
 [employer] Coordinator received incoming fax from [employer] payer [facility]). Payer approved TMS tx until 05/23/2024. Coordinator spoke with attending to gauge availability; will contact pt to schedule.

## 2024-01-27 ENCOUNTER — Other Ambulatory Visit (HOSPITAL_COMMUNITY): Attending: Student in an Organized Health Care Education/Training Program

## 2024-01-27 DIAGNOSIS — F329 Major depressive disorder, single episode, unspecified: Secondary | ICD-10-CM | POA: Diagnosis not present

## 2024-01-27 NOTE — Progress Notes (Signed)
 Pt reported to [facility] for cortical mapping and motor threshold determination for Repetitive Transcranial Magnetic Stimulation treatment for Major Depressive Disorder. Pt brought their spouse to their appointment for social support. Pt completed a PHQ-9 with a score of 10 (moderate depression), but with an intake score of 18 (moderate-severe). Prior to procedure, pt signed an informed consent agreement for TMS treatment. Pt's treatment area was found by applying single pulses to their left motor cortex, hunting along the anterior/posterior plane and along the superior oblique angle until the best motor response was elicited from the pt's right thumb.?The best response was observed at 12.5cm?A/P and 43?degrees SOA, with a coil angle of +10?degrees. Pt's motor threshold was calculated using the [employer]'s proprietary MT Assist algorithm, which produced a calculated motor threshold of 0.88?SMT. Per these findings, pt's treatment parameters are as follows: A/P -- 12.5?cm, SOA -- 43?degrees (primary), Coil Angle -- ?+10?degrees, Motor Threshold -- 0.88?SMT.?With these parameters, the pt will receive 36 sessions of TMS according to the following protocol: 3000 pulses per session, with stimulation in bursts of pulses lasting 4 seconds at a frequency of 10 Hz, separated by 11 seconds of rest. After determining pt's tx parameters, coil was moved to the treatment location, and the first burst of pulses was applied at a reduced power of 65%. Pt reported no complaints or discomfort, and stated that the stimulation was very tolerable. Pt ended their first session at 95%. Upon completion of mapping, pt consented to waiting for a 5-minute interval for Attending and TMS Techs observation of side effects. No side effects were observed by providers or TMS tech. Pt tolerated tx well. Pt and their spouse departed from clinic without issue.

## 2024-01-28 ENCOUNTER — Other Ambulatory Visit (HOSPITAL_COMMUNITY): Attending: Student in an Organized Health Care Education/Training Program

## 2024-01-28 DIAGNOSIS — F329 Major depressive disorder, single episode, unspecified: Secondary | ICD-10-CM | POA: Insufficient documentation

## 2024-01-28 NOTE — Progress Notes (Signed)
 Pt reported to [facility] for session #2 of Repetitive Transcranial Magnetic Stimulation treatment. Pt arrived alone and relayed that today was their spouse's birthday; they will be celebrating later on in the evening. Pt presented with appropriate affect, level mood and denied any suicidal or homicidal ideations. Pt reported no change in alcohol/substance use, caffeine consumption, sleep pattern or metal implant status since previous tx. Pt's treatment parameters are as follows: A/P -- 12.5?cm, SOA -- 43?degrees (primary), Coil Angle -- ?+10?degrees, Motor Threshold -- 0.88?SMT.?With these parameters, the pt will receive 36 sessions of TMS according to the following protocol: 3000 pulses per session, with stimulation in bursts of pulses lasting 4 seconds at a frequency of 10 Hz, separated by 11 seconds of rest.   After determining pt's tx parameters, coil was moved by TMS Tech to the treatment location. The first burst of pulses was applied at a reduced power of 80%. Pt reported no complaints or discomfort, and stated that the stimulation was tolerable/no different than previous session. Pt brought their iPad to read during the session, but still engaged in small talk intermittently with TMS Tech. No discomfort was reported during each tx power increase. Pt did report feeling a difference in intensity between 85% and 90%, but confirmed that no sharp pain was being felt. Pt agreed to trial 100% tx power for their last minute of session. Upon completion of mapping, pt consented to waiting for a 5-minute interval for TMS Techs observation of potential side effects. No side effects (dizzy/lightheaded) were observed by the TMS tech, or reported by the pt. Pt tolerated tx well. Pt departed post-treatment with no concerns or complaints.

## 2024-02-01 ENCOUNTER — Other Ambulatory Visit (HOSPITAL_COMMUNITY): Attending: Psychiatry

## 2024-02-01 VITALS — BP 151/74 | HR 54 | Temp 97.5°F | Resp 16

## 2024-02-01 DIAGNOSIS — F329 Major depressive disorder, single episode, unspecified: Secondary | ICD-10-CM | POA: Insufficient documentation

## 2024-02-01 NOTE — Progress Notes (Signed)
 Pt reported to [facility] for session #3 of Repetitive Transcranial Magnetic Stimulation treatment. Pt arrived alone and relayed that they had a good weekend enjoying the snow. Pt stated that no issues with traveling for session occurred. Pt presented with appropriate and happy affect, level mood and denied any suicidal or homicidal ideations. Pt reported no change in alcohol/substance use, caffeine consumption, sleep pattern or metal implant status since previous tx. Pt's treatment parameters are as follows: A/P -- 12.5?cm, SOA -- 43?degrees (primary), Coil Angle -- ?+10?degrees, Motor Threshold -- 0.88?SMT.?With these parameters, the pt will receive 36 sessions of TMS according to the following protocol: 3000 pulses per session, with stimulation in bursts of pulses lasting 4 seconds at a frequency of 10 Hz, separated by 11 seconds of rest.    Pt completed weekly vitals check and PHQ-9 assessment. Weekly vital signs read as follows: BP = 158/70, pulse = 52, temperature = 97.5, respiratory rate = 16, PHQ = 2. Pt mentioned the jump in their PHQ score and felt relieved. Pt then expressed concern with increased blood pressure. Pt said they normally presented with ..low blood pressure, but it has been awhile since its been checked. TMS Tech re-checked blood pressure after two minutes and obtained reading of 151/74, pulse rate was 54. [employer] Tech asked pt how they felt physically, and if any lifestyle or medication changes had been made since last week. Pt said that current medication (Luvox) was decreased from 150mg  to 100mg , which seemed to be working better in reducing apathetic/tired mood, but otherwise felt physically fine with no additional changes. [employer] Tech will relay to program attending and will troubleshoot digital BP cuff in case of tech malfunction.  Treatment coil was moved by TMS Tech to the individualized treatment location. The first burst of pulses was applied at a  reduced power of 80%. Pt reported no complaints or discomfort, and stated that the stimulation was tolerable/no different than previous session. Pt brought their iPad to read during the session; continued to engage in small talk intermittently with TMS Tech. No discomfort was reported during each tx power increase. Treatment coil needed adjustments in pressure placement intermittently throughout session. No adjustments needed to tx parameters. Pt did report feeling a difference in intensity between 90% and 95%, but confirmed that no sharp pain was being felt. Pt agreed to trial 100% tx power for their last 5 pulse trains, due to several pauses in session from sensitive tx coil. No discomfort reported during final increase for session. Upon completion, pt consented to waiting for a 5-minute interval for TMS Techs observation of potential side effects. No side effects (dizzy/lightheaded) were observed by the TMS tech, or reported by the pt. Pt tolerated tx well. Pt departed post-treatment with no concerns or complaints.

## 2024-02-02 ENCOUNTER — Other Ambulatory Visit (HOSPITAL_COMMUNITY): Attending: Psychiatry

## 2024-02-02 VITALS — BP 132/76 | HR 58

## 2024-02-02 DIAGNOSIS — F322 Major depressive disorder, single episode, severe without psychotic features: Secondary | ICD-10-CM | POA: Insufficient documentation

## 2024-02-02 DIAGNOSIS — F329 Major depressive disorder, single episode, unspecified: Secondary | ICD-10-CM

## 2024-02-02 NOTE — Progress Notes (Signed)
 Pt reported to [facility] for session #4 of Repetitive Transcranial Magnetic Stimulation treatment. Pt arrived alone and relayed they were in a bad mood because of all the things I have to do this week for work. Pt also relayed a slight headache was felt after leaving session the previous day, but related it to increase sinus pressure that has been a historical issue for pt. Pt still presented with a pleasant affect, level mood and denied any suicidal or homicidal ideations. Pt reported no change in alcohol/substance use, caffeine consumption, sleep pattern or metal implant status since previous tx. Pt's treatment parameters are as follows: A/P -- 12.5?cm, SOA -- 43?degrees (primary), Coil Angle -- ?+10?degrees, Motor Threshold -- 0.88?SMT.?With these parameters, the pt will receive 36 sessions of TMS according to the following protocol: 3000 pulses per session, with stimulation in bursts of pulses lasting 4 seconds at a frequency of 10 Hz, separated by 11 seconds of rest.    Attending provider requested TMS Tech to re-take BP with digital cuff and to ensure patient was sitting still, feet planted, with no talking during collection. TMS Tech collected BP reading to ensure accuracy in digital function and patient safety. BP read as follows: 128/78 (sitting), pulse = 56. Standing BP = 132/76, pulse = 58. Attending provider alerted of normal reading. TMS tech reminded pt that treatment could potentially increase sinus pressure, due to radiating stimulation of scalp/face, alongside the weather conditions, but provided homeopathic solutions to assist with reduction of pressure (e.g., massaging sinus area on face, cold compresses, over-the-counter medication). Pt acknowledged reminder and agreed to trial solutions.   Treatment coil was then moved by TMS Tech to the individualized treatment location. The first burst of pulses was applied at a reduced power of 85%. Pt reported no  complaints or discomfort. Pt read an e-book on their phone while still engaging intermittently with TMS Tech. No discomfort was reported during each tx power increase. Treatment coil needed adjustments in pressure placement intermittently throughout session. No adjustments needed to tx parameters.  Pt confirmed that no sharp pain was being felt behind the eye or eyebrow. Pt spent their last 5 minutes at 100% treatment power. No discomfort reported during final increase for session. Upon completion, pt consented to waiting for a 5-minute interval for TMS Techs observation of potential side effects. No side effects (dizzy/lightheaded) were observed by the TMS tech, or reported by the pt. Pt tolerated tx well. Pt departed post-treatment with no concerns or complaints.

## 2024-02-03 ENCOUNTER — Other Ambulatory Visit (HOSPITAL_COMMUNITY)

## 2024-02-03 DIAGNOSIS — F329 Major depressive disorder, single episode, unspecified: Secondary | ICD-10-CM | POA: Diagnosis present

## 2024-02-03 NOTE — Progress Notes (Signed)
 Pt reported to [facility] for session #5 of Repetitive Transcranial Magnetic Stimulation treatment. Pt presented with a pleasant affect, level mood and denied any suicidal or homicidal ideations. Pt reported no change in alcohol/substance use, caffeine consumption, sleep pattern or metal implant status since previous tx. Pt's treatment parameters are as follows: A/P -- 12.5?cm, SOA -- 43?degrees (primary), Coil Angle -- ?+10?degrees, Motor Threshold -- 0.88?SMT.?With these parameters, the pt will receive 36 sessions of TMS according to the following protocol: 3000 pulses per session, with stimulation in bursts of pulses lasting 4 seconds at a frequency of 10 Hz, separated by 11 seconds of rest.     Treatment coil was then moved by TMS Tech to the individualized treatment location. The first burst of pulses was applied at a reduced power of 90%. Pt reported no complaints or discomfort. Pt read an e-book on their phone for the majority of the session. Treatment coil needed adjustments in pressure placement intermittently throughout session, but no adjustments were needed to specific tx parameters.  Pt confirmed that no sharp pain was being felt behind the eye or eyebrow. No discomfort was reported during each tx power increase. Pt spent their last 2 minutes at 105% treatment power. No discomfort reported during final increase for session. Upon completion, pt consented to waiting for a 5-minute interval for TMS Techs observation of potential side effects. No side effects (dizzy/lightheaded) were observed by the TMS tech, or reported by the pt. Pt tolerated tx well. [employer] Tech discussed scheduling next week's appointment slots with pt. Due to potential inclement weather (snow), TMS Tech relayed to pt that they will be skipping session for Monday (02/07/24), but resuming regular sessions for remainder of the week unless otherwise stated. Pt agreed to plan. Pt then departed post-treatment  with no concerns or complaints.

## 2024-02-04 ENCOUNTER — Encounter (HOSPITAL_COMMUNITY): Payer: Self-pay

## 2024-02-04 ENCOUNTER — Other Ambulatory Visit (HOSPITAL_COMMUNITY)

## 2024-02-08 ENCOUNTER — Other Ambulatory Visit (HOSPITAL_COMMUNITY)

## 2024-02-08 VITALS — BP 135/66 | HR 61 | Temp 97.8°F | Resp 16

## 2024-02-08 DIAGNOSIS — F329 Major depressive disorder, single episode, unspecified: Secondary | ICD-10-CM

## 2024-02-08 NOTE — Progress Notes (Signed)
 Pt reported to [facility] for session #6 of Repetitive Transcranial Magnetic Stimulation treatment. Pt presented with a pleasant affect, level mood and denied any suicidal or homicidal ideations. Pt reported no change in alcohol/substance use, caffeine consumption, sleep pattern or metal implant status since previous tx. Pt's treatment parameters are as follows: A/P -- 12.5?cm, SOA -- 43?degrees (primary), Coil Angle -- ?+10?degrees, Motor Threshold -- 0.88?SMT.?With these parameters, the pt will receive 36 sessions of TMS according to the following protocol: 3000 pulses per session, with stimulation in bursts of pulses lasting 4 seconds at a frequency of 10 Hz, separated by 11 seconds of rest. Weekly vital signs and PHQ-9 screening were obtained and read as follows: BP= 131/73, pulse = 61, temperature = 97.8, respiratory rate = 16, PHQ = 0. A second reading of BP was taken immediately after session as read as 135/66, pulse rate = 61.     When completing the PHQ-9, patient stated I really only had one bad day this week. I was really just in a bad mood and felt depressed, but it didn't carry over into any other day, which explains low score. After collecting vitals, pt was concerned about their BP, but said it has to be due to the Luvox. I took Lexapro for years and had historically low blood pressure. [employer] Tech said they would collect a second reading after session ended. TMS Tech provided option of starting at 90% or utilizing a warm-up of 85%, pt elected to start at 90%. Treatment coil was then moved by TMS Tech to the individualized treatment location. The first burst of pulses was applied at a reduced power of 90%. Pt reported no complaints or discomfort. Pt read an e-book on their phone for the majority of the session. Treatment coil needed only slight adjustments in pressure placement throughout session, but no adjustments were needed to specific tx parameters.  Pt  confirmed that no sharp pain was being felt behind the eye or eyebrow. No discomfort was reported during each tx power increase. Pt spent their last 4 minutes at 105% treatment power. No discomfort reported during final increase for session. Upon completion, pt consented to waiting for a 5-minute interval for TMS Techs observation of potential side effects. No side effects (dizzy/lightheaded) were observed by the TMS tech, or reported by the pt. Pt tolerated tx well. TMS Tech obtained second BP reading (135/66, pulse = 61). Patient sat still, with no talking during both collections; confirmed they felt right as rain. Pt then asked if TMS Tech could reschedule 02/09/24 session time from 10am to 1pm. TMS Tech agreed. Pt then departed post-treatment with no concerns or complaints.

## 2024-02-09 ENCOUNTER — Other Ambulatory Visit (HOSPITAL_COMMUNITY)

## 2024-02-09 DIAGNOSIS — F329 Major depressive disorder, single episode, unspecified: Secondary | ICD-10-CM

## 2024-02-09 NOTE — Progress Notes (Signed)
 Pt reported to [facility] for session #7 of Repetitive Transcranial Magnetic Stimulation treatment. Pt presented with a pleasant affect, level mood and denied any suicidal or homicidal ideations. Pt reported no change in alcohol/substance use, caffeine consumption, sleep pattern or metal implant status since previous tx. Pt's treatment parameters are as follows: A/P -- 12.5?cm, SOA -- 43?degrees (primary), Coil Angle -- ?+10?degrees, Motor Threshold -- 0.88?SMT.?With these parameters, the pt will receive 36 sessions of TMS according to the following protocol: 3000 pulses per session, with stimulation in bursts of pulses lasting 4 seconds at a frequency of 10 Hz, separated by 11 seconds of rest.      Pt stated they experienced a bad mood yesterday after session, but it was related to work-stress. Pt reported feeling a mild headache upon arrival, but overall mood was stable/fine. TMS Tech said they would slow down titration intervals to assist with headache, but would continue to increase for overall progress. TMS Tech provided option of starting at higher percentage (95%) or repeating last session's starting percentage (90%). Pt agreed to start at 95%. TMS Tech then assured patient that if pulses felt too intense, they can titrate down if needed. Pt agreed to plan. Treatment coil was then moved by TMS Tech to the individualized treatment location. The first burst of pulses was applied at a reduced power of 95%. Pt reported no complaints or discomfort. Pt engaged in conversation with [employer] Tech for the entirety of session. Pt asked questions about history of TMS and requested additional marketing materials (brochures) to give to their colleagues. [employer] Tech agreed to collect and provide materials at the end of session. Treatment coil needed only slight adjustments in pressure placement throughout session, but no adjustments were needed to specific tx parameters.  Pt confirmed that no  sharp pain was being felt behind the eye or eyebrow. No discomfort was reported during each tx power increase. Pt spent their last 2 minutes at 110% treatment power. No discomfort reported during final increase for session. Upon completion, pt consented to waiting for a 5-minute interval for TMS Techs observation of potential side effects. No side effects (dizzy/lightheaded) were observed by the TMS tech, or reported by the pt. Pt tolerated tx well. Pt continued to engage in pleasant conversation with TMS Tech during escort to main lobby area. Pt then departed post-treatment with no concerns or complaints

## 2024-02-10 ENCOUNTER — Telehealth (HOSPITAL_COMMUNITY): Payer: Self-pay

## 2024-02-10 ENCOUNTER — Other Ambulatory Visit (HOSPITAL_COMMUNITY)

## 2024-02-10 DIAGNOSIS — F329 Major depressive disorder, single episode, unspecified: Secondary | ICD-10-CM

## 2024-02-10 NOTE — Telephone Encounter (Signed)
 Pt placed incoming call to TMS Coordinator and left vm requesting a change in appointment time, due to increased presence of black ice near their home. Pt said they could still leave early to attend if need be, but wanted to ask just in case. Coordinator called back, no answer. Left vm confirming that pt could arrive around lunchtime (12pm). Coordinator concluded vm saying that if they don't hear back from pt, wont change anything in system until they do, but still fine to arrive around noon.

## 2024-02-10 NOTE — Progress Notes (Signed)
 Pt reported to [facility] for session #8 of Repetitive Transcranial Magnetic Stimulation treatment. Pt presented with a pleasant affect, level mood and denied any suicidal or homicidal ideations. Pt reported no change in alcohol/substance use, caffeine consumption, sleep pattern or metal implant status since previous tx. Pt's treatment parameters are as follows: A/P -- 12.5?cm, SOA -- 43?degrees (primary), Coil Angle -- ?+10?degrees, Motor Threshold -- 0.88?SMT.?With these parameters, the pt will receive 36 sessions of TMS according to the following protocol: 3000 pulses per session, with stimulation in bursts of pulses lasting 4 seconds at a frequency of 10 Hz, separated by 11 seconds of rest.      Pt reported mild headache occurred after yesterday's session, but over-the-counter rx combo (Tylenol /Advil ) provided relief. Treatment coil was then moved by TMS Tech to the individualized treatment location. The first burst of pulses was applied at a reduced power of 95% as a starting warm-up. Pt reported no complaints or discomfort. Pt engaged in conversation with [employer] Tech for the entirety of session. Treatment power was increased to 100%, pt confirmed no sharp pain felt behind the eye or eyebrow. TMS Tech reverted to original titration intervals to continue making progress toward full tx power. Pt spent their last 5 minutes at 110%, no discomfort was reported or observed. Upon completion, pt consented to waiting for a 5-minute interval for TMS Techs observation of potential side effects. No side effects (dizzy/lightheaded) were observed by the TMS tech, or reported by the pt. Pt tolerated tx well. [employer] Tech discussed scheduling for next week's sessions with pt. Pt agreed to selected time slots. Pt then departed post-treatment with no concerns or complaints

## 2024-02-10 NOTE — Telephone Encounter (Signed)
 Pt called TMS Coordinator back after last message. Pt confirmed they would like to move their appt for 02/10/24 to 12:00 pm. Coordinator will reschedule in system.

## 2024-02-11 ENCOUNTER — Other Ambulatory Visit (HOSPITAL_COMMUNITY)

## 2024-02-11 DIAGNOSIS — F329 Major depressive disorder, single episode, unspecified: Secondary | ICD-10-CM

## 2024-02-11 NOTE — Progress Notes (Signed)
 Pt reported to [facility] for session #9 of Repetitive Transcranial Magnetic Stimulation treatment. Pt presented with a pleasant affect, level mood and denied any suicidal or homicidal ideations. Pt reported no change in alcohol/substance use, caffeine consumption, sleep pattern or metal implant status since previous tx. Pt's treatment parameters are as follows: A/P -- 12.5?cm, SOA -- 43?degrees (primary), Coil Angle -- ?+10?degrees, Motor Threshold -- 0.88?SMT.?With these parameters, the pt will receive 36 sessions of TMS according to the following protocol: 3000 pulses per session, with stimulation in bursts of pulses lasting 4 seconds at a frequency of 10 Hz, separated by 11 seconds of rest.      Pt reported an increase in depressive symptoms yesterday evening. Pt stated, my mood just started going downhill and I was just depressed last night. My spouse Sherl) reminded me that this could be the beginning of the 'dip' period. What do you think? TMS Tech asked pt to rate their severity of symptoms from 0-10, and pt responded with a score of 8. TMS Tech then asked if symptoms had caused any thoughts like maybe I just would be better off not being here? Pt declined active/passive SI & HI. TMS Tech asked if pt was able to enjoy any hobbies or utilized any coping skills. Pt responded confirming they were able to meditate and enjoy time with their family. TMS Tech then said that potential dip period could be starting, which is normal, especially since pt has been successful in tolerating higher tx percentages and had a lower SMT. [employer] Tech asked pt if they were familiar with [employer] testing. Pt declined. [employer] Tech provided light education on benefits to [place] testing and pt was agreeable to get tested. Treatment coil was then moved by TMS Tech to the individualized treatment location. The first burst of pulses was applied at a reduced power of 95% as a starting warm-up. Pt  reported no complaints or discomfort. Pt engaged in conversation with [employer] Tech for the entirety of session. Treatment power was increased to 100%, pt confirmed no sharp pain felt behind the eye or eyebrow. Pt spent their last 5 pulse trains at 110%, to assist with potential dip period. No discomfort was reported or observed. Upon completion, pt consented to waiting for a 5-minute interval for TMS Techs observation of potential side effects. No side effects (dizzy/lightheaded) were observed by the TMS tech, or reported by the pt. Pt tolerated tx well. Pt then departed post-treatment with no concerns or complaints

## 2024-02-14 ENCOUNTER — Other Ambulatory Visit (HOSPITAL_COMMUNITY)

## 2024-02-15 ENCOUNTER — Other Ambulatory Visit (HOSPITAL_COMMUNITY)

## 2024-02-16 ENCOUNTER — Other Ambulatory Visit (HOSPITAL_COMMUNITY)

## 2024-02-17 ENCOUNTER — Other Ambulatory Visit (HOSPITAL_COMMUNITY)

## 2024-02-18 ENCOUNTER — Other Ambulatory Visit (HOSPITAL_COMMUNITY)
